# Patient Record
Sex: Female | Born: 1955 | Race: Black or African American | Hispanic: No | Smoking: Former smoker
Health system: Southern US, Community
[De-identification: ages and names within clinical notes are randomized; demographics above are authoritative.]

## PROBLEM LIST (undated history)

## (undated) DIAGNOSIS — IMO0001 Reserved for inherently not codable concepts without codable children: Secondary | ICD-10-CM

## (undated) DIAGNOSIS — D649 Anemia, unspecified: Secondary | ICD-10-CM

## (undated) DIAGNOSIS — Z789 Other specified health status: Secondary | ICD-10-CM

## (undated) DIAGNOSIS — I10 Essential (primary) hypertension: Secondary | ICD-10-CM

## (undated) DIAGNOSIS — E119 Type 2 diabetes mellitus without complications: Secondary | ICD-10-CM

## (undated) DIAGNOSIS — N184 Chronic kidney disease, stage 4 (severe): Secondary | ICD-10-CM

## (undated) DIAGNOSIS — I509 Heart failure, unspecified: Secondary | ICD-10-CM

---

## 2018-05-29 ENCOUNTER — Inpatient Hospital Stay
Admission: EM | Admit: 2018-05-29 | Discharge: 2018-05-30 | DRG: 291 | Disposition: A | Discharge status: Skilled Nursing Facility | Payer: BC Managed Care – PPO | Attending: Internal Medicine | Admitting: Internal Medicine

## 2018-05-29 ENCOUNTER — Emergency Department: Payer: BC Managed Care – PPO

## 2018-05-29 ENCOUNTER — Other Ambulatory Visit: Payer: Self-pay

## 2018-05-29 ENCOUNTER — Encounter: Payer: Self-pay | Admitting: Emergency Medicine

## 2018-05-29 DIAGNOSIS — Z515 Encounter for palliative care: Secondary | ICD-10-CM | POA: Diagnosis not present

## 2018-05-29 DIAGNOSIS — J810 Acute pulmonary edema: Secondary | ICD-10-CM

## 2018-05-29 DIAGNOSIS — E1122 Type 2 diabetes mellitus with diabetic chronic kidney disease: Secondary | ICD-10-CM | POA: Diagnosis present

## 2018-05-29 DIAGNOSIS — D631 Anemia in chronic kidney disease: Secondary | ICD-10-CM | POA: Diagnosis present

## 2018-05-29 DIAGNOSIS — N184 Chronic kidney disease, stage 4 (severe): Secondary | ICD-10-CM | POA: Diagnosis present

## 2018-05-29 DIAGNOSIS — I509 Heart failure, unspecified: Secondary | ICD-10-CM

## 2018-05-29 DIAGNOSIS — Z531 Procedure and treatment not carried out because of patient's decision for reasons of belief and group pressure: Secondary | ICD-10-CM | POA: Diagnosis present

## 2018-05-29 DIAGNOSIS — Z7189 Other specified counseling: Secondary | ICD-10-CM | POA: Diagnosis not present

## 2018-05-29 DIAGNOSIS — I5043 Acute on chronic combined systolic (congestive) and diastolic (congestive) heart failure: Secondary | ICD-10-CM | POA: Diagnosis present

## 2018-05-29 DIAGNOSIS — J9621 Acute and chronic respiratory failure with hypoxia: Secondary | ICD-10-CM | POA: Diagnosis present

## 2018-05-29 DIAGNOSIS — Z7401 Bed confinement status: Secondary | ICD-10-CM

## 2018-05-29 DIAGNOSIS — J441 Chronic obstructive pulmonary disease with (acute) exacerbation: Secondary | ICD-10-CM | POA: Diagnosis present

## 2018-05-29 DIAGNOSIS — Z880 Allergy status to penicillin: Secondary | ICD-10-CM | POA: Diagnosis not present

## 2018-05-29 DIAGNOSIS — I130 Hypertensive heart and chronic kidney disease with heart failure and stage 1 through stage 4 chronic kidney disease, or unspecified chronic kidney disease: Secondary | ICD-10-CM | POA: Diagnosis present

## 2018-05-29 DIAGNOSIS — Z87891 Personal history of nicotine dependence: Secondary | ICD-10-CM | POA: Diagnosis not present

## 2018-05-29 DIAGNOSIS — D649 Anemia, unspecified: Secondary | ICD-10-CM

## 2018-05-29 HISTORY — DX: Type 2 diabetes mellitus without complications: E11.9

## 2018-05-29 HISTORY — DX: Essential (primary) hypertension: I10

## 2018-05-29 HISTORY — DX: Anemia, unspecified: D64.9

## 2018-05-29 HISTORY — DX: Chronic kidney disease, stage 4 (severe): N18.4

## 2018-05-29 HISTORY — DX: Heart failure, unspecified: I50.9

## 2018-05-29 HISTORY — DX: Other specified health status: Z78.9

## 2018-05-29 HISTORY — DX: Reserved for inherently not codable concepts without codable children: IMO0001

## 2018-05-29 LAB — CBC WITH DIFFERENTIAL/PLATELET
Abs Immature Granulocytes: 0.02 10*3/uL (ref 0.00–0.07)
BASOS ABS: 0.1 10*3/uL (ref 0.0–0.1)
Basophils Relative: 1 %
EOS ABS: 0 10*3/uL (ref 0.0–0.5)
Eosinophils Relative: 0 %
HEMATOCRIT: 22.2 % — AB (ref 36.0–46.0)
Hemoglobin: 6.5 g/dL — ABNORMAL LOW (ref 12.0–15.0)
Immature Granulocytes: 0 %
Lymphocytes Relative: 18 %
Lymphs Abs: 1.3 10*3/uL (ref 0.7–4.0)
MCH: 30.5 pg (ref 26.0–34.0)
MCHC: 29.3 g/dL — ABNORMAL LOW (ref 30.0–36.0)
MCV: 104.2 fL — ABNORMAL HIGH (ref 80.0–100.0)
Monocytes Absolute: 0.5 10*3/uL (ref 0.1–1.0)
Monocytes Relative: 7 %
Neutro Abs: 5.3 10*3/uL (ref 1.7–7.7)
Neutrophils Relative %: 74 %
Platelets: 349 10*3/uL (ref 150–400)
RBC: 2.13 MIL/uL — ABNORMAL LOW (ref 3.87–5.11)
RDW: 17.3 % — AB (ref 11.5–15.5)
WBC: 7.2 10*3/uL (ref 4.0–10.5)
nRBC: 0.4 % — ABNORMAL HIGH (ref 0.0–0.2)

## 2018-05-29 LAB — INFLUENZA PANEL BY PCR (TYPE A & B)
Influenza A By PCR: NEGATIVE
Influenza B By PCR: NEGATIVE

## 2018-05-29 LAB — BASIC METABOLIC PANEL
Anion gap: 8 (ref 5–15)
BUN: 91 mg/dL — ABNORMAL HIGH (ref 8–23)
CO2: 23 mmol/L (ref 22–32)
Calcium: 7.8 mg/dL — ABNORMAL LOW (ref 8.9–10.3)
Chloride: 113 mmol/L — ABNORMAL HIGH (ref 98–111)
Creatinine, Ser: 3.12 mg/dL — ABNORMAL HIGH (ref 0.44–1.00)
GFR calc Af Amer: 18 mL/min — ABNORMAL LOW (ref >60)
GFR calc non Af Amer: 15 mL/min — ABNORMAL LOW (ref >60)
Glucose, Bld: 145 mg/dL — ABNORMAL HIGH (ref 70–99)
Potassium: 4.3 mmol/L (ref 3.5–5.1)
Sodium: 144 mmol/L (ref 135–145)

## 2018-05-29 LAB — GLUCOSE, CAPILLARY: Glucose-Capillary: 195 mg/dL — ABNORMAL HIGH (ref 70–99)

## 2018-05-29 LAB — HEMOGLOBIN A1C
HEMOGLOBIN A1C: 5.5 % (ref 4.8–5.6)
MEAN PLASMA GLUCOSE: 111.15 mg/dL

## 2018-05-29 LAB — TROPONIN I: Troponin I: 0.04 ng/mL (ref <0.03)

## 2018-05-29 MED ORDER — FUROSEMIDE 10 MG/ML IJ SOLN: 80.0000 mg | Freq: Two times a day (BID) | INTRAMUSCULAR | Status: DC

## 2018-05-29 MED ORDER — ALBUTEROL SULFATE (2.5 MG/3ML) 0.083% IN NEBU
5.0000 mg | INHALATION_SOLUTION | Freq: Once | RESPIRATORY_TRACT | Status: AC
  Administered 2018-05-29: 5 mg via RESPIRATORY_TRACT
  Filled 2018-05-29: qty 6

## 2018-05-29 MED ORDER — MAGNESIUM SULFATE 2 GM/50ML IV SOLN
2.0000 g | INTRAVENOUS | Status: AC
  Administered 2018-05-29: 2 g via INTRAVENOUS
  Filled 2018-05-29: qty 50

## 2018-05-29 MED ORDER — INSULIN ASPART 100 UNIT/ML ~~LOC~~ SOLN
0.0000 [IU] | Freq: Three times a day (TID) | SUBCUTANEOUS | Status: DC
  Administered 2018-05-30: 5 [IU] via SUBCUTANEOUS
  Administered 2018-05-30: 3 [IU] via SUBCUTANEOUS
  Filled 2018-05-29 (×2): qty 1

## 2018-05-29 MED ORDER — SODIUM CHLORIDE 0.9 % IV SOLN
2.0000 g | Freq: Once | INTRAVENOUS | Status: AC
  Administered 2018-05-29: 2 g via INTRAVENOUS
  Filled 2018-05-29: qty 20

## 2018-05-29 MED ORDER — SODIUM CHLORIDE 0.9 % IV SOLN
500.0000 mg | Freq: Once | INTRAVENOUS | Status: AC
  Administered 2018-05-29: 500 mg via INTRAVENOUS
  Filled 2018-05-29: qty 500

## 2018-05-29 MED ORDER — ACETAMINOPHEN 650 MG RE SUPP: 650.0000 mg | Freq: Four times a day (QID) | RECTAL | Status: DC | PRN

## 2018-05-29 MED ORDER — HYDRALAZINE HCL 20 MG/ML IJ SOLN: 10.0000 mg | Freq: Four times a day (QID) | INTRAMUSCULAR | Status: DC | PRN

## 2018-05-29 MED ORDER — ACETAMINOPHEN 325 MG PO TABS: 650.0000 mg | ORAL_TABLET | Freq: Four times a day (QID) | ORAL | Status: DC | PRN

## 2018-05-29 MED ORDER — METHYLPREDNISOLONE SODIUM SUCC 125 MG IJ SOLR
125.0000 mg | Freq: Once | INTRAMUSCULAR | Status: AC
  Administered 2018-05-29: 125 mg via INTRAVENOUS
  Filled 2018-05-29: qty 2

## 2018-05-29 MED ORDER — POLYETHYLENE GLYCOL 3350 17 G PO PACK: 17.0000 g | PACK | Freq: Every day | ORAL | Status: DC | PRN

## 2018-05-29 MED ORDER — HEPARIN SODIUM (PORCINE) 5000 UNIT/ML IJ SOLN: 5000.0000 [IU] | Freq: Three times a day (TID) | INTRAMUSCULAR | Status: DC

## 2018-05-29 MED ORDER — FUROSEMIDE 10 MG/ML IJ SOLN
40.0000 mg | Freq: Once | INTRAMUSCULAR | Status: AC
  Administered 2018-05-29: 40 mg via INTRAVENOUS
  Filled 2018-05-29: qty 4

## 2018-05-29 MED ORDER — ALBUTEROL SULFATE (2.5 MG/3ML) 0.083% IN NEBU: 2.5000 mg | INHALATION_SOLUTION | RESPIRATORY_TRACT | Status: DC | PRN

## 2018-05-29 MED ORDER — SODIUM CHLORIDE 0.9% FLUSH
3.0000 mL | Freq: Two times a day (BID) | INTRAVENOUS | Status: DC
  Administered 2018-05-29 – 2018-05-30 (×2): 3 mL via INTRAVENOUS

## 2018-05-29 MED ORDER — ONDANSETRON HCL 4 MG PO TABS: 4.0000 mg | ORAL_TABLET | Freq: Four times a day (QID) | ORAL | Status: DC | PRN

## 2018-05-29 MED ORDER — ONDANSETRON HCL 4 MG/2ML IJ SOLN: 4.0000 mg | Freq: Four times a day (QID) | INTRAMUSCULAR | Status: DC | PRN

## 2018-05-29 NOTE — ED Notes (Signed)
Indwelling catheter not inserted, entry made in error

## 2018-05-29 NOTE — ED Provider Notes (Signed)
[facility] Emergency Department Provider Note  ____________________________________________  Time seen: Approximately 6:57 PM  I have reviewed the triage vital signs and the nursing notes.   HISTORY  Chief Complaint Shortness of Breath    HPI Jenny [name] is a 62 y.o. female with a history of diabetes hypertension and CHF who comes to the ED due to shortness of breath that started today.  Gradual onset, worsening.  No fever or chills.  Denies chest pain.  Shortness of breath is severe without aggravating or alleviating factors.      Past Medical History:  Diagnosis Date  . Anemia   . CHF (congestive heart failure) (Jenny [name])   . Diabetes mellitus without complication (Jenny [name])   . Hypertension      There are no active problems to display for this patient.    History reviewed. No pertinent surgical history.   Prior to Admission medications   Not on File     Allergies Penicillins   No family history on file.  Social History Social History   Tobacco Use  . Smoking status: Former Research scientist (life sciences)  . Smokeless tobacco: Never Used  Substance Use Topics  . Alcohol use: Not Currently  . Drug use: Never    Review of Systems  Constitutional:   No fever or chills.  ENT:   No sore throat. No rhinorrhea. Cardiovascular:   No chest pain or syncope. Respiratory:   Positive shortness of breath and cough. Gastrointestinal:   Negative for abdominal pain, vomiting and diarrhea.  Musculoskeletal:   Negative for focal pain or swelling All other systems reviewed and are negative except as documented above in ROS and HPI.  ____________________________________________   PHYSICAL EXAM:  VITAL SIGNS: ED Triage Vitals  Enc Vitals Group     BP 05/29/18 1645 (!) 160/76     Pulse Rate 05/29/18 1645 60     Resp 05/29/18 1652 (!) 22     Temp 05/29/18 1652 97.9 F (36.6 C)     Temp Source 05/29/18 1652 Oral     SpO2 05/29/18 1645 97 %     Weight --      Height  --      Head Circumference --      [place] --      Pain Score 05/29/18 1652 0     Pain Loc --      Pain Edu? --      Excl. in [place]? --     Vital signs reviewed, nursing assessments reviewed.   Constitutional:   Alert and oriented.  Ill-appearing. Eyes:   Conjunctivae are normal. EOMI. PERRL. ENT      Head:   Normocephalic and atraumatic.      Nose:   No congestion/rhinnorhea.       Mouth/Throat:   MMM, no pharyngeal erythema. No peritonsillar mass.       Neck:   No meningismus. Full ROM. Hematological/Lymphatic/Immunilogical:   No cervical lymphadenopathy. Cardiovascular:   RRR. Symmetric bilateral radial and DP pulses.  No murmurs. Cap refill less than 2 seconds. Respiratory: Tachypnea, increased work of breathing.  Prolonged expiratory phase and diffuse expiratory wheezing.  Crackles at left base.  Only able to speak 1 word at a time.. Gastrointestinal:   Soft and nontender. Non distended. There is no CVA tenderness.  No rebound, rigidity, or guarding.  Musculoskeletal:   Normal range of motion in all extremities. No joint effusions.  No lower extremity tenderness.  No edema. Neurologic:   Normal  speech and language.  Motor grossly intact. No acute focal neurologic deficits are appreciated.  Skin:    Skin is warm, dry and intact. No rash noted.  No petechiae, purpura, or bullae.  ____________________________________________    LABS (pertinent positives/negatives) (all labs ordered are listed, but only abnormal results are displayed) Labs Reviewed  CBC WITH DIFFERENTIAL/PLATELET - Abnormal; Notable for the following components:      Result Value   RBC 2.13 (*)    Hemoglobin 6.5 (*)    HCT 22.2 (*)    MCV 104.2 (*)    MCHC 29.3 (*)    RDW 17.3 (*)    nRBC 0.4 (*)    All other components within normal limits  BASIC METABOLIC PANEL - Abnormal; Notable for the following components:   Chloride 113 (*)    Glucose, Bld 145 (*)    BUN 91 (*)    Creatinine, Ser 3.12 (*)     Calcium 7.8 (*)    GFR calc non Af Amer 15 (*)    GFR calc Af Amer 18 (*)    All other components within normal limits  TROPONIN I - Abnormal; Notable for the following components:   Troponin I 0.04 (*)    All other components within normal limits  INFLUENZA PANEL BY PCR (TYPE A & B)   ____________________________________________   EKG  Interpreted by me Sinus rhythm rate of 59, normal axis and intervals.  Poor R wave progression.  Normal ST segments and T waves.  No ischemic changes.  ____________________________________________    [id number]  Dg Chest 2 View  Result Date: 05/29/2018 CLINICAL DATA:  Shortness of breath, wheezing, hypoxia EXAM: CHEST - 2 VIEW COMPARISON:  04/05/2018 FINDINGS: Cardiomegaly with vascular congestion. Interstitial prominence likely reflects interstitial edema. Moderate left pleural effusion and small right effusion with bibasilar atelectasis. No acute bony abnormality. IMPRESSION: Cardiomegaly with vascular congestion and probable interstitial edema. Bilateral effusions, left greater than right with bibasilar atelectasis. Electronically Signed   By: Rolm Baptise M.D.   On: 05/29/2018 18:44    ____________________________________________   PROCEDURES Procedures  ____________________________________________  DIFFERENTIAL DIAGNOSIS   COPD exacerbation, pneumonia, pulmonary edema, pleural effusion.  Doubt ACS PE dissection AAA or pneumothorax.  CLINICAL IMPRESSION / ASSESSMENT AND PLAN / ED COURSE  Pertinent labs & imaging results that were available during my care of the patient were reviewed by me and considered in my medical decision making (see chart for details).      Clinical Course as of May 29 1856  Thu May 29, 2018  1654 Patient presents with acute respiratory illness with evidence of bronchospasm and wheezing.  I doubt sepsis on initial assessment.  Will check labs and a chest x-ray, give bronchodilators and steroids, plan for  hospitalization.   [PS]  0300 Baseline CKD.  Chronic anemia.  Jehovah's Witness.  Creatinine(!): 3.12 [PS]    Clinical Course User Index [PS] Jenny Mew, MD    ----------------------------------------- 7:13 PM on 05/29/2018 -----------------------------------------  Labs essentially at baseline, chest x-ray shows pulmonary edema.  Patient ordered IV Lasix.  Plan to admit for further diuresis and respiratory support.   ____________________________________________   FINAL CLINICAL IMPRESSION(S) / ED DIAGNOSES    Final diagnoses:  COPD exacerbation ([place])  Acute pulmonary edema ([place])  Acute respiratory failure with hypoxia   ED Discharge Orders    None      Portions of this note were generated with dragon dictation software. Dictation errors may occur despite best attempts  at proofreading.   Jenny Mew, MD 05/29/18 [phone]

## 2018-05-29 NOTE — ED Triage Notes (Signed)
Pt to ED via EMS from [facility] with c/o SOB, hx of CHF, anemia. 1 albuterol and 1 duoneb given PTA. PT on 8L Micro from EMS. PT RA saturation at this time is 88%. MD at bedside

## 2018-05-29 NOTE — ED Notes (Signed)
Pt external catheter repositioned due to pt feeling "wet", pt brief clean and try, pt provided a sandwich tray and cup of water with MD approval. Pt expresses no further complaints at this time

## 2018-05-29 NOTE — H&P (Addendum)
[place] [facility] [place] NAME: Jenny [name]    MR#:  [id number]  DATE OF BIRTH:  [birth year]  DATE OF ADMISSION:  05/29/2018  PRIMARY CARE PHYSICIAN: Juluis Pitch, MD   REQUESTING/REFERRING PHYSICIAN: Dr. Joni Fears  CHIEF COMPLAINT:   Chief Complaint  Patient presents with  . Shortness of Breath    HISTORY OF PRESENT ILLNESS:  Jenny [name]  is a 62 y.o. female with a known history of chronic systolic CHF, CKD stage IV, chronic anemia, Jehovah's Witness,, diabetes mellitus, bedbound status who was recently admitted at [facility] in the early part of December for CHF and sent to [facility] for rehab presents due to worsening shortness of breath and weakness.  Patient's chest x-ray shows bilateral pleural effusions with pulmonary edema.  Is alert and awake and able to provide history.  History also obtained from son at bedside and reviewed records from [facility].  PAST MEDICAL HISTORY:   Past Medical History:  Diagnosis Date  . Anemia   . CHF (congestive heart failure) ([place])   . CKD (chronic kidney disease) stage 4, GFR 15-29 ml/min (HCC)   . Diabetes mellitus without complication ([place])   . Hypertension     PAST SURGICAL HISTORY:  History reviewed. No pertinent surgical history.  SOCIAL HISTORY:   Social History   Tobacco Use  . Smoking status: Former Research scientist (life sciences)  . Smokeless tobacco: Never Used  Substance Use Topics  . Alcohol use: Not Currently    FAMILY HISTORY:  No family history on file.  DRUG ALLERGIES:   Allergies  Allergen Reactions  . Penicillins Rash    REVIEW OF SYSTEMS:   Review of Systems  Constitutional: Positive for malaise/fatigue. Negative for chills and fever.  HENT: Negative for sore throat.   Eyes: Negative for blurred vision, double vision and pain.  Respiratory: Positive for cough and shortness of breath. Negative for hemoptysis and wheezing.   Cardiovascular: Positive for orthopnea and leg swelling. Negative  for chest pain and palpitations.  Gastrointestinal: Negative for abdominal pain, constipation, diarrhea, heartburn, nausea and vomiting.  Genitourinary: Negative for dysuria and hematuria.  Musculoskeletal: Negative for back pain and joint pain.  Skin: Negative for rash.  Neurological: Negative for sensory change, speech change, focal weakness and headaches.  Endo/Heme/Allergies: Does not bruise/bleed easily.  Psychiatric/Behavioral: Positive for memory loss. Negative for depression. The patient is not nervous/anxious.     MEDICATIONS AT HOME:   Prior to Admission medications   Not on File     VITAL SIGNS:  Blood pressure (!) 174/71, pulse 64, temperature 97.9 F (36.6 C), temperature source Oral, resp. rate 15, SpO2 95 %.  PHYSICAL EXAMINATION:  Physical Exam  GENERAL:  62 y.o.-year-old patient lying in the bed. EYES: Pupils equal, round, reactive to light and accommodation. No scleral icterus. Extraocular muscles intact.  HEENT: Head atraumatic, normocephalic. Oropharynx and nasopharynx clear. No oropharyngeal erythema, moist oral mucosa  NECK:  Supple, no jugular venous distention. No thyroid enlargement, no tenderness.  LUNGS: Bilateral wheezing CARDIOVASCULAR: S1, S2 normal. No murmurs, rubs, or gallops.  ABDOMEN: Soft, nontender, nondistended. Bowel sounds present. No organomegaly or mass.  EXTREMITIES: No cyanosis, or clubbing. + 2 pedal & radial pulses b/l.  Bilateral lower extremity edema NEUROLOGIC: Cranial nerves II through XII are intact. No focal Motor or sensory deficits appreciated b/l PSYCHIATRIC: The patient is alert and oriented x 3. Anxious SKIN: No obvious rash, lesion, or ulcer.   LABORATORY PANEL:   CBC Recent  Labs  Lab 05/29/18 1701  WBC 7.2  HGB 6.5*  HCT 22.2*  PLT 349   ------------------------------------------------------------------------------------------------------------------  Chemistries  Recent Labs  Lab 05/29/18 1701  NA 144  K  4.3  CL 113*  CO2 23  GLUCOSE 145*  BUN 91*  CREATININE 3.12*  CALCIUM 7.8*   ------------------------------------------------------------------------------------------------------------------  Cardiac Enzymes Recent Labs  Lab 05/29/18 1701  TROPONINI 0.04*   ------------------------------------------------------------------------------------------------------------------  RADIOLOGY:  Dg Chest 2 View  Result Date: 05/29/2018 CLINICAL DATA:  Shortness of breath, wheezing, hypoxia EXAM: CHEST - 2 VIEW COMPARISON:  04/05/2018 FINDINGS: Cardiomegaly with vascular congestion. Interstitial prominence likely reflects interstitial edema. Moderate left pleural effusion and small right effusion with bibasilar atelectasis. No acute bony abnormality. IMPRESSION: Cardiomegaly with vascular congestion and probable interstitial edema. Bilateral effusions, left greater than right with bibasilar atelectasis. Electronically Signed   By: Rolm Baptise M.D.   On: 05/29/2018 18:44     IMPRESSION AND PLAN:   *Acute on chronic systolic congestive heart failure with acute hypoxic respiratory failure - IV Lasix 80 mg BID - Input and Output - Counseled to limit fluids and Salt - Monitor Bun/Cr and Potassium - Echo-reviewed from [facility] records.  Ejection fraction 45 to 50%.  *CKD stage IV.  Monitor while being diuresed.  Nephrology consult if worsening.  Would benefit from Lasix drip if an adequate diuresis. Discussed with patient and she does not want hemodialysis.  *Diabetes mellitus.  Sliding scale insulin.  *Anemia of chronic disease.  No bleeding.  Jehovah's Witness.  No transfusion.  *Hypertension.  Home medications not available.  Would hold medications at this time while being diuresed.  Patient had significant issues with hypotension in [facility] with diuresis.  DVT prophylaxis with subcutaneous heparin   All the records are reviewed and case discussed with ED provider. Management plans discussed  with the patient, family and they are in agreement.  CODE STATUS: FULL CODE  TOTAL TIME TAKING CARE OF THIS PATIENT: 40 minutes.   Leia Alf Buddy Loeffelholz M.D on 05/29/2018 at 8:41 PM  Between 7am to 6pm - Pager - [phone]  After 6pm go to [email or web address] - password [email or web address]  Office  [phone]  CC: Primary care physician; Juluis Pitch, MD  Note: This dictation was prepared with Dragon dictation along with smaller phrase technology. Any transcriptional errors that result from this process are unintentional.

## 2018-05-29 NOTE — ED Notes (Signed)
Pt provided warm blanket by this tech, and pt belongings packed up in preparation to be transported upstairs, pt expresses no further complaints at this time

## 2018-05-29 NOTE — Progress Notes (Signed)
Advance care planning  Purpose of Encounter Ingestive heart failure, CKD and anemia  Parties in Attendance Patient and her 2 sons at bedside  Patients Decisional capacity Patient is alert and oriented.  Able to make medical decisions. Healthcare power of attorney is her son Tonna Boehringer  Discussed with patient and son at bedside regarding her congestive heart failure and need for admission.  All questions answered  Discussed extensively regarding her CODE STATUS.  Patient continues to be wanting to be a full code with aggressive treatment.  But she does not want hemodialysis even if her kidneys worsen.  We discussed regarding having palliative care discuss goals of care further in the morning.  Patient and son agree.  Orders entered for full code  Time spent - 18 minutes

## 2018-05-30 ENCOUNTER — Encounter: Payer: Self-pay | Admitting: Primary Care

## 2018-05-30 DIAGNOSIS — Z7189 Other specified counseling: Secondary | ICD-10-CM

## 2018-05-30 DIAGNOSIS — I5043 Acute on chronic combined systolic (congestive) and diastolic (congestive) heart failure: Secondary | ICD-10-CM

## 2018-05-30 DIAGNOSIS — Z515 Encounter for palliative care: Secondary | ICD-10-CM

## 2018-05-30 LAB — IRON AND TIBC
IRON: 23 ug/dL — AB (ref 28–170)
Saturation Ratios: 11 % (ref 10.4–31.8)
TIBC: 220 ug/dL — AB (ref 250–450)
UIBC: 197 ug/dL

## 2018-05-30 LAB — BASIC METABOLIC PANEL
Anion gap: 11 (ref 5–15)
BUN: 103 mg/dL — ABNORMAL HIGH (ref 8–23)
CHLORIDE: 112 mmol/L — AB (ref 98–111)
CO2: 23 mmol/L (ref 22–32)
Calcium: 8.1 mg/dL — ABNORMAL LOW (ref 8.9–10.3)
Creatinine, Ser: 3.1 mg/dL — ABNORMAL HIGH (ref 0.44–1.00)
GFR calc Af Amer: 18 mL/min — ABNORMAL LOW (ref >60)
GFR calc non Af Amer: 15 mL/min — ABNORMAL LOW (ref >60)
Glucose, Bld: 269 mg/dL — ABNORMAL HIGH (ref 70–99)
Potassium: 4.5 mmol/L (ref 3.5–5.1)
SODIUM: 146 mmol/L — AB (ref 135–145)

## 2018-05-30 LAB — RETICULOCYTES
Immature Retic Fract: 30.2 % — ABNORMAL HIGH (ref 2.3–15.9)
RBC.: 2.18 MIL/uL — ABNORMAL LOW (ref 3.87–5.11)
Retic Count, Absolute: 107.9 10*3/uL (ref 19.0–186.0)
Retic Ct Pct: 5 % — ABNORMAL HIGH (ref 0.4–3.1)

## 2018-05-30 LAB — CBC
HCT: 22 % — ABNORMAL LOW (ref 36.0–46.0)
Hemoglobin: 6.5 g/dL — ABNORMAL LOW (ref 12.0–15.0)
MCH: 30.1 pg (ref 26.0–34.0)
MCHC: 29.5 g/dL — ABNORMAL LOW (ref 30.0–36.0)
MCV: 101.9 fL — ABNORMAL HIGH (ref 80.0–100.0)
Platelets: 368 10*3/uL (ref 150–400)
RBC: 2.16 MIL/uL — ABNORMAL LOW (ref 3.87–5.11)
RDW: 17.1 % — ABNORMAL HIGH (ref 11.5–15.5)
WBC: 6.9 10*3/uL (ref 4.0–10.5)
nRBC: 0.3 % — ABNORMAL HIGH (ref 0.0–0.2)

## 2018-05-30 LAB — MRSA PCR SCREENING: MRSA by PCR: NEGATIVE

## 2018-05-30 LAB — FERRITIN: Ferritin: 530 ng/mL — ABNORMAL HIGH (ref 11–307)

## 2018-05-30 LAB — VITAMIN B12: Vitamin B-12: 864 pg/mL (ref 180–914)

## 2018-05-30 LAB — FOLATE: Folate: 11.9 ng/mL (ref >5.9)

## 2018-05-30 LAB — GLUCOSE, CAPILLARY
GLUCOSE-CAPILLARY: 241 mg/dL — AB (ref 70–99)
Glucose-Capillary: 282 mg/dL — ABNORMAL HIGH (ref 70–99)

## 2018-05-30 MED ORDER — ATORVASTATIN CALCIUM 80 MG PO TABS: 80.0000 mg | ORAL_TABLET | Freq: Every day | ORAL | 11 refills | Status: DC

## 2018-05-30 MED ORDER — ISOSORBIDE MONONITRATE ER 60 MG PO TB24: 60.0000 mg | ORAL_TABLET | Freq: Every day | ORAL | Status: AC

## 2018-05-30 MED ORDER — HYDRALAZINE HCL 25 MG PO TABS: 25.0000 mg | ORAL_TABLET | Freq: Two times a day (BID) | ORAL | 0 refills | Status: DC

## 2018-05-30 MED ORDER — HYDRALAZINE HCL 25 MG PO TABS
25.0000 mg | ORAL_TABLET | Freq: Two times a day (BID) | ORAL | Status: DC
  Administered 2018-05-30: 25 mg via ORAL
  Filled 2018-05-30: qty 1

## 2018-05-30 MED ORDER — BUMETANIDE 1 MG PO TABS: 1.0000 mg | ORAL_TABLET | Freq: Every day | ORAL | 11 refills | Status: DC

## 2018-05-30 MED ORDER — FUROSEMIDE 10 MG/ML IJ SOLN
80.0000 mg | Freq: Two times a day (BID) | INTRAMUSCULAR | Status: DC
  Administered 2018-05-30: 80 mg via INTRAVENOUS
  Filled 2018-05-30: qty 8

## 2018-05-30 MED ORDER — SODIUM CHLORIDE 0.9 % IV SOLN
510.0000 mg | Freq: Once | INTRAVENOUS | Status: AC
  Administered 2018-05-30: 510 mg via INTRAVENOUS
  Filled 2018-05-30: qty 17

## 2018-05-30 NOTE — Consult Note (Signed)
MEDICATION RELATED CONSULT NOTE - INITIAL   Pharmacy Consult for feraheme dosing Indication: IDA and hgb 6.5 with CKD not on dialysis  Allergies  Allergen Reactions  . Penicillins Rash    Patient Measurements: Height: 5\' 5"  (165.1 cm) Weight: 142 lb 12.8 oz (64.8 kg) IBW/kg (Calculated) : 57 Adjusted Body Weight:   Vital Signs: Temp: 97.8 F (36.6 C) (12/27 0356) Temp Source: Oral (12/27 0356) BP: 173/69 (12/27 0356) Pulse Rate: 70 (12/27 0356) Intake/Output from previous day: 12/26 0701 - 12/27 0700 In: 402.1 [IV Piggyback:402.1] Out: 100 [Urine:100] Intake/Output from this shift: Total I/O In: -  Out: 150 [Urine:150]  Labs: Recent Labs    05/29/18 1701 05/30/18 0511  WBC 7.2 6.9  HGB 6.5* 6.5*  HCT 22.2* 22.0*  PLT 349 368  CREATININE 3.12* 3.10*   Estimated Creatinine Clearance: 16.7 mL/min (A) (by C-G formula based on SCr of 3.1 mg/dL (H)).   Microbiology: Recent Results (from the past 720 hour(s))  MRSA PCR Screening     Status: None   Collection Time: 05/30/18  4:39 AM  Result Value Ref Range Status   MRSA by PCR NEGATIVE NEGATIVE Final    Comment:        The GeneXpert MRSA Assay (FDA approved for NASAL specimens only), is one component of a comprehensive MRSA colonization surveillance program. It is not intended to diagnose MRSA infection nor to guide or monitor treatment for MRSA infections. Performed at [facility], [address] [place], [place] [address]     Medical History: Past Medical History:  Diagnosis Date  . Anemia   . CHF (congestive heart failure) ([place])   . CKD (chronic kidney disease) stage 4, GFR 15-29 ml/min (HCC)   . Diabetes mellitus without complication ([place])   . Hypertension   . Patient is Jehovah's Witness     Assessment: 62 y.o. female with a known history of chronic systolic CHF, CKD stage IV, chronic anemia (Jehovah's Witness), diabetes mellitus, bedbound status.  Hgb 6.5.  Iron studies mildly  below lower limit of reference range.  Calculated hgb iron deficit is approximately 1000 mg.  Originally consulted for venofer but due to MD discharging the patient discussed one time dose of feraheme 510 mg then for patient to follow up with nephrologist outpatient for second dose approximate 7 days after initial dose (3-8 days later).  Plan:  Feraheme 510 mg IV once.  Forrest Moron, PharmD Clinical Pharmacist 05/30/2018,11:14 AM

## 2018-05-30 NOTE — Progress Notes (Signed)
   05/30/18 1050  Clinical Encounter Type  Visited With Patient not available  Visit Type Initial (OR for AD)  Referral From Nurse  Recommendations follow up again later today   Chaplain attempted to respond to OR for AD.  Patient did not respond to chaplain's knock or voice.  Attempt follow up later today.

## 2018-05-30 NOTE — Discharge Summary (Signed)
[facility] NAME: Jenny [name]    MR#:  [id number]  DATE OF BIRTH:  [birth year]  DATE OF ADMISSION:  05/29/2018 ADMITTING PHYSICIAN: Hillary Bow, MD  DATE OF DISCHARGE: 05/30/2018  PRIMARY CARE PHYSICIAN: Juluis Pitch, MD    ADMISSION DIAGNOSIS:  Acute pulmonary edema ([place]) [J81.0] COPD exacerbation ([place]) [J44.1] Chronic anemia [D64.9]  DISCHARGE DIAGNOSIS:  Active Problems:   CHF (congestive heart failure) ([place])  Anemia Jehovah's Witness SECONDARY DIAGNOSIS:   Past Medical History:  Diagnosis Date  . Anemia   . CHF (congestive heart failure) ([place])   . CKD (chronic kidney disease) stage 4, GFR 15-29 ml/min (HCC)   . Diabetes mellitus without complication ([place])   . Hypertension   . 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  1.  Acute on chronic hypoxic respiratory failure due to acute on chronic combined systolic and diastolic heart failure: Patient has been weaned to her baseline oxygen of 2 L. She was diuresed with IV Lasix Due to her anemia patient will have ongoing episodes of congestive heart failure flare.  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  I believe that the anemia is playing a large role in her congestive heart failure.  This was discussed with the patient.  She also has renal failure stage IV approaching stage V followed by nephrology which is also affecting her pulmonary status. During her last admission in December she was discharged with Bumex which she will continue.    If heart rate allows as an outpatient she may have Coreg restarted. CHF clinic upon discharge   2.  Chronic kidney disease stage IV, baseline  creatinine around 3.0: Creatinine has remained stable.  She will follow-up with her nephrologist.  3.  Anemia of chronic kidney disease: Patient is Jehovah's Witness.  Her baseline hemoglobin is 6.5 and it remained stable. She did receive 1 dose of Feraheme and will need another dose 7 days from now.  This can be given by her nephrologist.  4.  Essential hypertension: Patient will be discharged on Isosorbide.  Her heart rate is low and therefore I am uncomfortable to restart the Coreg.  She will need outpatient palliative care services.    DISCHARGE CONDITIONS AND DIET:   Guarded condition on heart healthy diet  CONSULTS OBTAINED:    DRUG ALLERGIES:   Allergies  Allergen Reactions  . Penicillins Rash    DISCHARGE MEDICATIONS:   Allergies as of 05/30/2018      Reactions   Penicillins Rash      Medication List    TAKE these medications   atorvastatin 80 MG tablet Commonly known as:  LIPITOR Take 1 tablet (80 mg total) by mouth daily.   bumetanide 1 MG tablet Commonly known as:  BUMEX Take 1 tablet (1 mg total) by mouth daily.   isosorbide mononitrate 60 MG 24 hr tablet Commonly known as:  IMDUR Take 1 tablet (60 mg total) by mouth daily.         Today   CHIEF COMPLAINT:   Shortness of breath with improvement.  Denies lower extremity edema, chest pain   VITAL SIGNS:  Blood pressure (!) 173/69, pulse 70, temperature 97.8 F (36.6  C), temperature source Oral, resp. rate 16, height 5\' 5"  (1.651 m), weight 64.8 kg, SpO2 93 %.   REVIEW OF SYSTEMS:  Review of Systems  Constitutional: Negative.  Negative for chills, fever and malaise/fatigue.  HENT: Negative.  Negative for ear discharge, ear pain, hearing loss, nosebleeds and sore throat.   Eyes: Negative.  Negative for blurred vision and pain.  Respiratory: Positive for shortness of breath (better). Negative for cough, hemoptysis and wheezing.   Cardiovascular: Negative.  Negative for chest pain,  palpitations and leg swelling.  Gastrointestinal: Negative.  Negative for abdominal pain, blood in stool, diarrhea, nausea and vomiting.  Genitourinary: Negative.  Negative for dysuria.  Musculoskeletal: Negative.  Negative for back pain.  Skin: Negative.   Neurological: Negative for dizziness, tremors, speech change, focal weakness, seizures and headaches.  Endo/Heme/Allergies: Negative.  Does not bruise/bleed easily.  Psychiatric/Behavioral: Negative.  Negative for depression, hallucinations and suicidal ideas.     PHYSICAL EXAMINATION:  GENERAL:  62 y.o.-year-old patient lying in the bed with no acute distress.  NECK:  Supple, no jugular venous distention. No thyroid enlargement, no tenderness.  LUNGS: Normal breath sounds bilaterally, no wheezing, rales,rhonchi  No use of accessory muscles of respiration.  CARDIOVASCULAR: S1, S2 normal. No murmurs, rubs, or gallops.  ABDOMEN: Soft, non-tender, non-distended. Bowel sounds present. No organomegaly or mass.  EXTREMITIES: No pedal edema, cyanosis, or clubbing.  PSYCHIATRIC: The patient is alert and oriented x 3.  SKIN: No obvious rash, lesion, or ulcer.   DATA REVIEW:   CBC Recent Labs  Lab 05/30/18 0511  WBC 6.9  HGB 6.5*  HCT 22.0*  PLT 368    Chemistries  Recent Labs  Lab 05/30/18 0511  NA 146*  K 4.5  CL 112*  CO2 23  GLUCOSE 269*  BUN 103*  CREATININE 3.10*  CALCIUM 8.1*    Cardiac Enzymes Recent Labs  Lab 05/29/18 1701  TROPONINI 0.04*    Microbiology Results  @MICRORSLT48 @  RADIOLOGY:  Dg Chest 2 View  Result Date: 05/29/2018 CLINICAL DATA:  Shortness of breath, wheezing, hypoxia EXAM: CHEST - 2 VIEW COMPARISON:  04/05/2018 FINDINGS: Cardiomegaly with vascular congestion. Interstitial prominence likely reflects interstitial edema. Moderate left pleural effusion and small right effusion with bibasilar atelectasis. No acute bony abnormality. IMPRESSION: Cardiomegaly with vascular congestion and  probable interstitial edema. Bilateral effusions, left greater than right with bibasilar atelectasis. Electronically Signed   By: Rolm Baptise M.D.   On: 05/29/2018 18:44      Allergies as of 05/30/2018      Reactions   Penicillins Rash      Medication List    TAKE these medications   atorvastatin 80 MG tablet Commonly known as:  LIPITOR Take 1 tablet (80 mg total) by mouth daily.   bumetanide 1 MG tablet Commonly known as:  BUMEX Take 1 tablet (1 mg total) by mouth daily.   isosorbide mononitrate 60 MG 24 hr tablet Commonly known as:  IMDUR Take 1 tablet (60 mg total) by mouth daily.          Management plans discussed with the patient and she is in agreement. Stable for discharge   Patient should follow up with nephrology   CODE STATUS:     Code Status Orders  (From admission, onward)         Start     Ordered   05/29/18 2020  Full code  Continuous     05/29/18 2023  Code Status History    This patient has a current code status but no historical code status.      TOTAL TIME TAKING CARE OF THIS PATIENT: 38 minutes.    Note: This dictation was prepared with Dragon dictation along with smaller phrase technology. Any transcriptional errors that result from this process are unintentional.  Traivon Morrical M.D on 05/30/2018 at 11:17 AM  Between 7am to 6pm - Pager - [phone] After 6pm go to [email or web address] - password [email or web address]  Office  [phone]  CC: Primary care physician; Juluis Pitch, MD

## 2018-05-30 NOTE — Consult Note (Addendum)
Consultation Note Date: 05/30/2018   Patient Name: Jenny [name]  DOB: [birth year]  MRN: [id number]  Age / Sex: 62 y.o., female  PCP: Jenny Pitch, MD Referring Physician: Bettey Costa, MD  Reason for Consultation: Establishing goals of care and Psychosocial/spiritual support  HPI/Patient Profile: 62 y.o. female  with past medical history of heart failure, stage IV kidney disease, diabetes, hypertension, anemia complicated by [facility] Witness, will not accept blood products admitted on 05/29/2018 with CHF.  Palliative medicine consulted for goals of care discussion.   Clinical Assessment and Goals of Care: Jenny [name] is resting quietly in bed.  She will make but only briefly keep eye contact.  She has a flat affect but is calm and cooperative.  There is no family at bedside at this time.  She tells me that she is originally from [place].  We talked about her faith, Jehovah's Witness.  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  We talked about her heart failure and management of heart failure.  We talked about returning to the rehab.  I ask if it has helped so far.  She states that rehab has helped her, but slowly.  Talked about healthcare power of attorney, see below. We talked about CODE STATUS, see below.  I asked what she would think about palliative services as outpatient.  She tells me that it would be good, "from a sons 10:01 AM".   Please consult PMT again upon next admission.  HCPOA  NEXT OF KIN   Jenny [name] names Jenny [name] first, Jenny [name] second her healthcare surrogates.  I share that it is important that her sons know what she does want, and what she does not want.  She agrees.  No HCPOA paperwork noted in Epic chart.    SUMMARY OF RECOMMENDATIONS   Continue to treat the treatable. Declines blood products due to faith, Jehovah's Witness. Also  declines hemodialysis due to faith, Jehovah's Witness. Continue CODE STATUS discussions.  Code Status/Advance Care Planning:  Full code -we talked about, "treat the treatable, but allow a natural death".  I share that if/when her heart naturally stops, God is calling, I consider that CPR and intubation would not change outcomes and she may suffer at the end.  I encouraged her to consider treating what is treatable, but allowing natural death.  Symptom Management:   Per hospitalist, no additional needs at this time.  Palliative Prophylaxis:   No specific needs at this time.  Additional Recommendations (Limitations, Scope, Preferences):  Full Scope Treatment, No Blood Transfusions and No Hemodialysis  Psycho-social/Spiritual:   Desire for further Chaplaincy support:no  Additional Recommendations: Caregiving  Support/Resources and Education on Hospice  Prognosis:   < 6 months, 6 or less would not be surprising based on frailty, poor functional status, chronic disease burden, patient's faith which does not allow her to take blood or have dialysis.  Complicated by relatively young age of 69.  Discharge Planning: Return to rehab      Primary  Diagnoses: Present on Admission: **None**   I have reviewed the medical record, interviewed the patient and family, and examined the patient. The following aspects are pertinent.  Past Medical History:  Diagnosis Date  . Anemia   . CHF (congestive heart failure) ([place])   . CKD (chronic kidney disease) stage 4, GFR 15-29 ml/min (HCC)   . Diabetes mellitus without complication ([place])   . Hypertension   . Patient is Sales promotion account executive Witness    Social History   Socioeconomic History  . Marital status: Divorced    Spouse name: Not on file  . Number of children: Not on file  . Years of education: Not on file  . Highest education level: Not on file  Occupational History  . Not on file  Social Needs  . Financial resource strain: Not on file    . Food insecurity:    Worry: Not on file    Inability: Not on file  . Transportation needs:    Medical: Not on file    Non-medical: Not on file  Tobacco Use  . Smoking status: Former Research scientist (life sciences)  . Smokeless tobacco: Never Used  Substance and Sexual Activity  . Alcohol use: Not Currently  . Drug use: Never  . Sexual activity: Not on file  Lifestyle  . Physical activity:    Days per week: Not on file    Minutes per session: Not on file  . Stress: Not on file  Relationships  . Social connections:    Talks on phone: Not on file    Gets together: Not on file    Attends religious service: Not on file    Active member of club or organization: Not on file    Attends meetings of clubs or organizations: Not on file    Relationship status: Not on file  Other Topics Concern  . Not on file  Social History Narrative  . Not on file   History reviewed. No pertinent family history. Scheduled Meds: . furosemide  80 mg Intravenous BID  . hydrALAZINE  25 mg Oral BID  . insulin aspart  0-9 Units Subcutaneous TID WC  . sodium chloride flush  3 mL Intravenous Q12H   Continuous Infusions: . ferumoxytol     PRN Meds:.acetaminophen **OR** acetaminophen, albuterol, hydrALAZINE, ondansetron **OR** ondansetron (ZOFRAN) IV, polyethylene glycol Medications Prior to Admission:  Prior to Admission medications   Medication Sig Start Date End Date Taking? Authorizing Provider  atorvastatin (LIPITOR) 80 MG tablet Take 1 tablet (80 mg total) by mouth daily. 05/30/18 05/30/19  Jenny Costa, MD  bumetanide (BUMEX) 1 MG tablet Take 1 tablet (1 mg total) by mouth daily. 05/30/18 05/30/19  Jenny Costa, MD  isosorbide mononitrate (IMDUR) 60 MG 24 hr tablet Take 1 tablet (60 mg total) by mouth daily. 05/30/18   Jenny Costa, MD   Allergies  Allergen Reactions  . Penicillins Rash   Review of Systems  Unable to perform ROS: Acuity of condition    Physical Exam Vitals signs and nursing note reviewed.   Constitutional:      Appearance: She is ill-appearing.     Comments: Makes but only briefly keeps eye contact, appears chronically ill and frail  HENT:     Head:     Comments: Some temporal wasting Cardiovascular:     Rate and Rhythm: Normal rate.  Pulmonary:     Effort: Pulmonary effort is normal. No accessory muscle usage or respiratory distress.  Abdominal:     Palpations: Abdomen is soft.  Musculoskeletal:     Right lower leg: No edema.     Left lower leg: No edema.  Skin:    General: Skin is warm and dry.  Neurological:     Mental Status: She is oriented to person, place, and time.  Psychiatric:     Comments: Flat affect, makes and only briefly keeps eye contact     Vital Signs: BP (!) 173/69 (BP Location: Right Arm)   Pulse 70   Temp 97.8 F (36.6 C) (Oral)   Resp 16   Ht 5\' 5"  (1.651 m)   Wt 64.8 kg   SpO2 93%   BMI 23.76 kg/m  Pain Scale: 0-10   Pain Score: 0-No pain   SpO2: SpO2: 93 % O2 Device:SpO2: 93 % O2 Flow Rate: .O2 Flow Rate (L/min): 2 L/min  IO: Intake/output summary:   Intake/Output Summary (Last 24 hours) at 05/30/2018 1143 Last data filed at 05/30/2018 1624 Gross per 24 hour  Intake 402.1 ml  Output 250 ml  Net 152.1 ml    LBM:   Baseline Weight: Weight: 64.2 kg Most recent weight: Weight: 64.8 kg     Palliative Assessment/Data:   Flowsheet Rows     Most Recent Value  Intake Tab  Referral Department  Hospitalist  Unit at Time of Referral  Cardiac/Telemetry Unit  Date Notified  05/29/18  Palliative Care Type  New Palliative care  Reason for referral  Clarify Goals of Care  Date of Admission  05/29/18  Date first seen by Palliative Care  05/30/18  # of days Palliative referral response time  1 Day(s)  # of days IP prior to Palliative referral  0  Clinical Assessment  Palliative Performance Scale Score  30%  Pain Max last 24 hours  Not able to report  Pain Min Last 24 hours  Not able to report  Dyspnea Max Last 24 Hours   Not able to report  Dyspnea Min Last 24 hours  Not able to report  Psychosocial & Spiritual Assessment  Palliative Care Outcomes      Time In: 1210 Time Out: 1245 Time Total: 35 minutes Greater than 50%  of this time was spent counseling and coordinating care related to the above assessment and plan.  Signed by: Drue Novel, NP   Please contact Palliative Medicine Team phone at [phone] for questions and concerns.  For individual provider: See Shea Evans

## 2018-05-30 NOTE — Evaluation (Signed)
Physical Therapy Evaluation Patient Details Name: Jenny [name] MRN: [id number] DOB: [birth year] Today's Date: 05/30/2018   History of Present Illness  Patient is a 62 year old female admitted for COPD exacerbation following c/o O2 desat.  PMH includes Htn, DM, CKD, CHF and anemia.  Clinical Impression  Pt is a 62 year old female who has been admitted from SNF.  She reports to have been working with PT and walking very little recently.  PT appears very ill and lethargic but agreeable to attempt bed mobility.  She was able to bring LE's to EOB and attempt sup to sit with hand held assist but stated she was too "weak" and laid back in bed.  Pt demonstrated understanding of supine exercises, reciting back to PT.  She was agreeable to education provided by PT concerning HEP and prevention of pressure ulcers.  Her O2 sats remained WNL throughout.  Pt will continue to benefit from skilled PT with focus on strength, tolerance to activity and safe functional mobility.  SNF setting is appropriate for discharge due to pt not functioning at baseline.    Follow Up Recommendations SNF    Equipment Recommendations  None recommended by PT    Recommendations for Other Services       Precautions / Restrictions Precautions Precautions: Fall Restrictions Weight Bearing Restrictions: No      Mobility  Bed Mobility Overal bed mobility: Needs Assistance Bed Mobility: Supine to Sit     Supine to sit: Mod assist     General bed mobility comments: Pt  was able to bring LE's to EOB and sit up partially with hand held assist.  She stated that she felt too weak and sick to move further at this time.  Transfers                    Ambulation/Gait                Stairs            Wheelchair Mobility    Modified Rankin (Stroke Patients Only)       Balance                                             Pertinent Vitals/Pain Pain Assessment: No/denies pain     Home Living Family/patient expects to be discharged to:: Skilled nursing facility                      Prior Function           Comments: When asked if she has been walking, pt replied "no".  Has been working with therapy on "getting in and out of the chair".     Hand Dominance        Extremity/Trunk Assessment   Upper Extremity Assessment Upper Extremity Assessment: Generalized weakness    Lower Extremity Assessment Lower Extremity Assessment: Generalized weakness    Cervical / Trunk Assessment Cervical / Trunk Assessment: Kyphotic  Communication   Communication: No difficulties  Cognition Arousal/Alertness: Lethargic Behavior During Therapy: Restless Overall Cognitive Status: Within Functional Limits for tasks assessed                                 General Comments: Pt appearing very  weak and slow to  respond to questions.  She did follow directions well but became tearful when she attempted bed mobility, stating that she felt "too weak" to get up.      General Comments      Exercises Other Exercises Other Exercises: Reviewed HEP to perform while in bed.  Pt recited back exercises and demonstrated such as ankle pumps, heel slides and pillow squeezes. x4 min Other Exercises: Assisted pt in positioning with pillows for prevention of pressure ulcers and educated pt concerning importance of frequent movement and avoidance of prolonged pressure on bony prominences. x4 min   Assessment/Plan    PT Assessment Patient needs continued PT services  PT Problem List Decreased strength;Decreased mobility;Decreased activity tolerance;Decreased balance;Decreased knowledge of use of DME       PT Treatment Interventions DME instruction;Functional mobility training;Balance training;Patient/family education;Gait training;Therapeutic activities;Stair training;Therapeutic exercise    PT Goals (Current goals can be found in the Care Plan section)   Acute Rehab PT Goals Patient Stated Goal: To regain strength and feel well enough to work with therapy. PT Goal Formulation: With patient Time For Goal Achievement: 06/13/18 Potential to Achieve Goals: Good    Frequency Min 2X/week   Barriers to discharge        Co-evaluation               AM-PAC PT "6 Clicks" Mobility  Outcome Measure Help needed turning from your back to your side while in a flat bed without using bedrails?: A Lot Help needed moving from lying on your back to sitting on the side of a flat bed without using bedrails?: A Lot Help needed moving to and from a bed to a chair (including a wheelchair)?: A Lot Help needed standing up from a chair using your arms (e.g., wheelchair or bedside chair)?: A Lot Help needed to walk in hospital room?: A Lot Help needed climbing 3-5 steps with a railing? : A Lot 6 Click Score: 12    End of Session Equipment Utilized During Treatment: Oxygen Activity Tolerance: Patient limited by fatigue;Patient limited by lethargy Patient left: in bed;with call bell/phone within reach   PT Visit Diagnosis: Muscle weakness (generalized) (M62.81)    Time: 6160-7371 PT Time Calculation (min) (ACUTE ONLY): 15 min   Charges:   PT Evaluation $PT Eval Low Complexity: 1 Low PT Treatments $Therapeutic Activity: 8-22 mins        Roxanne Gates, PT, DPT   Roxanne Gates 05/30/2018, 10:19 AM

## 2018-05-30 NOTE — Clinical Social Work Note (Signed)
Clinical Social Work Assessment  Patient Details  Name: Jenny [name] MRN: [id number] Date of Birth: 1955-11-30  Date of referral:  05/30/18               Reason for consult:  Facility Placement                Permission sought to share information with:  Facility Sport and exercise psychologist Permission granted to share information::  Yes, Verbal Permission Granted  Name::     Jenny [name]   [phone] or Jenny [name] Son   [phone]   Agency::  SNF admissions  Relationship::     Contact Information:     Housing/Transportation Living arrangements for the past 2 months:  [place], [place] of Information:  Patient, Medical Team Patient Interpreter Needed:  None Criminal Activity/Legal Involvement Pertinent to Current Situation/Hospitalization:  No - Comment as needed Significant Relationships:  Adult Children Lives with:  Self Do you feel safe going back to the place where you live?  No Need for family participation in patient care:  No (Coment)  Care giving concerns:  Patient states she does not have any concerns about returning back to SNF.   Social Worker assessment / plan:  Patient is a 62 year old female who is alert and oriented x4.  Patient has been receiving short term rehab at [facility], and has been there for a few days.  Patient states she is pleased with how things are going at [facility].  Patient was informed that she has been approved to return back to [facility] to continue with her therapy.  Patient did not express any questions or concerns about returning back to SNF.   Patient gave CSW permission to send clinical information to [facility].  Employment status:  Therapist, music:  Managed Care PT Recommendations:  [place] / Referral to community resources:  [place]  Patient/Family's Response to care:  Patient is in agreement to returning back to SNF.  Patient/Family's  Understanding of and Emotional Response to Diagnosis, Current Treatment, and Prognosis:  Patient expressed that she is hopeful that she can continue with her rehab and return home soon.  Emotional Assessment Appearance:  Appears older than stated age Attitude/Demeanor/Rapport:    Affect (typically observed):  Pleasant, Appropriate, Stable Orientation:  Oriented to Self, Oriented to Place, Oriented to  Time, Oriented to Situation Alcohol / Substance use:  Not Applicable Psych involvement (Current and /or in the community):  No (Comment)  Discharge Needs  Concerns to be addressed:  Care Coordination, Lack of Support Readmission within the last 30 days:  No Current discharge risk:  None Barriers to Discharge:  No Barriers Identified   Jenny [name] 05/30/2018, 4:39 PM

## 2018-05-30 NOTE — Progress Notes (Signed)
Patient discharged via ems. Son at bedside.

## 2018-05-30 NOTE — NC FL2 (Signed)
[place] LEVEL OF CARE SCREENING TOOL     IDENTIFICATION  Patient Name: Jenny [name] Birthdate: 1956/05/19 Sex: female Admission Date (Current Location): 05/29/2018  [place] and [place] Number:  Engineering geologist and Address:  [facility], [address] [place], [place] [address]      Provider Number: [id number]  Attending Physician Name and Address:  Bettey Costa, MD  Relative Name and Phone Number:  [name]   [phone] or     Current Level of Care: Hospital Recommended Level of Care: [place] Prior Approval Number:    Date Approved/Denied:   PASRR Number: [id number]  Discharge Plan: SNF    Current Diagnoses: Patient Active Problem List   Diagnosis Date Noted  . CHF (congestive heart failure) ([facility]) 05/29/2018    Orientation RESPIRATION BLADDER Height & Weight     Self, Time, Situation, Place  Normal Incontinent Weight: 142 lb 12.8 oz (64.8 kg) Height:  5\' 5"  (165.1 cm)  BEHAVIORAL SYMPTOMS/MOOD NEUROLOGICAL BOWEL NUTRITION STATUS      Continent Diet(Carb modified)  AMBULATORY STATUS COMMUNICATION OF NEEDS Skin   Limited Assist Verbally Normal, Other (Comment)                       Personal Care Assistance Level of Assistance  Feeding, Dressing, Bathing Bathing Assistance: Limited assistance Feeding assistance: Independent Dressing Assistance: Limited assistance     Functional Limitations Info  Sight, Speech, Hearing Sight Info: Adequate Hearing Info: Adequate Speech Info: Adequate    SPECIAL CARE FACTORS FREQUENCY  PT (By licensed PT), OT (By licensed OT)     PT Frequency: 5x a week OT Frequency: 5x a week            Contractures Contractures Info: Not present    Additional Factors Info  Code Status, Allergies, Insulin Sliding Scale Code Status Info: Full Code Allergies Info: PENICILLINS   Insulin Sliding Scale Info: insulin aspart (novoLOG) injection 0-9  Units 3x a day with meals       Current Medications (05/30/2018):  This is the current hospital active medication list Current Facility-Administered Medications  Medication Dose Route Frequency Provider Last Rate Last Dose  . acetaminophen (TYLENOL) tablet 650 mg  650 mg Oral Q6H PRN Hillary Bow, MD       Or  . acetaminophen (TYLENOL) suppository 650 mg  650 mg Rectal Q6H PRN Sudini, Srikar, MD      . albuterol (PROVENTIL) (2.5 MG/3ML) 0.083% nebulizer solution 2.5 mg  2.5 mg Nebulization Q2H PRN Sudini, Srikar, MD      . furosemide (LASIX) injection 80 mg  80 mg Intravenous BID Sudini, Srikar, MD      . hydrALAZINE (APRESOLINE) injection 10 mg  10 mg Intravenous Q6H PRN Sudini, Srikar, MD      . hydrALAZINE (APRESOLINE) tablet 25 mg  25 mg Oral BID Bettey Costa, MD   25 mg at 05/30/18 0839  . insulin aspart (novoLOG) injection 0-9 Units  0-9 Units Subcutaneous TID WC Hillary Bow, MD   3 Units at 05/30/18 [phone]  . ondansetron (ZOFRAN) tablet 4 mg  4 mg Oral Q6H PRN Sudini, Alveta Heimlich, MD       Or  . ondansetron (ZOFRAN) injection 4 mg  4 mg Intravenous Q6H PRN Sudini, Srikar, MD      . polyethylene glycol (MIRALAX / GLYCOLAX) packet 17 g  17 g Oral Daily PRN Hillary Bow, MD      .  sodium chloride flush (NS) 0.9 % injection 3 mL  3 mL Intravenous Q12H Hillary Bow, MD   3 mL at 05/30/18 0840     Discharge Medications: Please see discharge summary for a list of discharge medications.  Relevant Imaging Results:  Relevant Lab Results:   Additional Information SSN [id number]  Ross Ludwig, [place]

## 2018-05-30 NOTE — Clinical Social Work Note (Signed)
CSW received phone call from [facility] that patient has been approved to return to SNF.  CSW spoke to patient and she is in agreement to returning back to SNF.  Jones Broom. Norval Morton, MSW, [place]  05/30/2018 4:31 PM

## 2018-05-30 NOTE — Clinical Social Work Note (Signed)
CSW was informed that patient is from [facility] under short term rehab.  CSW to follow patient's progress throughout discharge planning, patient will need insurance reauthorization before she is ready to return back to SNF.  Jones Broom. Rodriguez Camp, MSW, [place]  05/30/2018 9:14 AM

## 2018-05-30 NOTE — Progress Notes (Signed)
   05/30/18 1505  Clinical Encounter Type  Visited With Patient  Visit Type Follow-up  Recommendations try again later today   Chaplain attempted follow up on order.  Patient appeared to be sleeping but did respond to chaplain's voice and requested that chaplain return at another time.

## 2018-05-31 LAB — HIV ANTIBODY (ROUTINE TESTING W REFLEX): HIV Screen 4th Generation wRfx: NONREACTIVE

## 2018-06-12 ENCOUNTER — Ambulatory Visit: Payer: BC Managed Care – PPO | Admitting: Family

## 2018-06-12 NOTE — Progress Notes (Deleted)
   Patient ID: Vivi Piccirilli, female    DOB: 26-Aug-1955, 63 y.o.   MRN: [id number]  HPI  Ms Rodenbaugh is a 63 y/o female with a history of  Echo report from 05/08/18 reviewed and showed an EF of 40-45% along with grade II diastolic dysfunction and mild/moderate TR.   Admitted 05/29/18 due to acute on chronic heart failure. Palliative care was consulted. Initially needed IV lasix and then transitioned to oral diuretics. Was able to be weaned back down to her 2L of oxygen. Has chronic anemia but is a Jehovah's witness so will not accept blood products. Discharged the following day. Admitted 05/07/18 due to hypothermia, bradycardia and hypotensive. She initially required bear hugger, bipap and multiple doses of atropine. Head CT was negative. Given antibiotics. Aggressively diuresed. Meds were adjusted due to hypotension and carvedilol was held but was able to be resumed. Discharged after 9 days.  She presents today for her initial visit with a chief complaint of   Review of Systems    Physical Exam    Assessment & Plan:  1: Chronic heart failure with mildly reduced EF- - NYHA class - pro- BNP 05/07/18 was 17,900  2: HTN- - BP - BMP 05/30/18 reviewed and showed sodium 146, potassium 4.5, creatinine 31.0 and GFR 18  3: DM- - A1c 05/19/18 was 5.5%  4: Anemia- - CBC 05/30/18 reviewed and showed hemoglobin of 6.5 - patient is [employer] witness so does not receive blood products

## 2018-06-26 ENCOUNTER — Inpatient Hospital Stay
Admission: EM | Admit: 2018-06-26 | Discharge: 2018-07-01 | DRG: 291 | Disposition: A | Discharge status: Skilled Nursing Facility | Payer: BC Managed Care – PPO | Source: Skilled Nursing Facility | Attending: Internal Medicine | Admitting: Internal Medicine

## 2018-06-26 ENCOUNTER — Emergency Department: Payer: BC Managed Care – PPO

## 2018-06-26 ENCOUNTER — Other Ambulatory Visit: Payer: Self-pay

## 2018-06-26 DIAGNOSIS — J9621 Acute and chronic respiratory failure with hypoxia: Secondary | ICD-10-CM | POA: Diagnosis present

## 2018-06-26 DIAGNOSIS — Z8249 Family history of ischemic heart disease and other diseases of the circulatory system: Secondary | ICD-10-CM | POA: Diagnosis not present

## 2018-06-26 DIAGNOSIS — N179 Acute kidney failure, unspecified: Secondary | ICD-10-CM | POA: Diagnosis present

## 2018-06-26 DIAGNOSIS — Z79899 Other long term (current) drug therapy: Secondary | ICD-10-CM

## 2018-06-26 DIAGNOSIS — I5043 Acute on chronic combined systolic (congestive) and diastolic (congestive) heart failure: Secondary | ICD-10-CM | POA: Diagnosis present

## 2018-06-26 DIAGNOSIS — D509 Iron deficiency anemia, unspecified: Secondary | ICD-10-CM | POA: Diagnosis present

## 2018-06-26 DIAGNOSIS — E1122 Type 2 diabetes mellitus with diabetic chronic kidney disease: Secondary | ICD-10-CM | POA: Diagnosis present

## 2018-06-26 DIAGNOSIS — Z9981 Dependence on supplemental oxygen: Secondary | ICD-10-CM

## 2018-06-26 DIAGNOSIS — N184 Chronic kidney disease, stage 4 (severe): Secondary | ICD-10-CM | POA: Diagnosis present

## 2018-06-26 DIAGNOSIS — J90 Pleural effusion, not elsewhere classified: Secondary | ICD-10-CM

## 2018-06-26 DIAGNOSIS — D539 Nutritional anemia, unspecified: Secondary | ICD-10-CM | POA: Diagnosis present

## 2018-06-26 DIAGNOSIS — R0902 Hypoxemia: Secondary | ICD-10-CM

## 2018-06-26 DIAGNOSIS — J441 Chronic obstructive pulmonary disease with (acute) exacerbation: Secondary | ICD-10-CM | POA: Diagnosis present

## 2018-06-26 DIAGNOSIS — N189 Chronic kidney disease, unspecified: Secondary | ICD-10-CM

## 2018-06-26 DIAGNOSIS — E875 Hyperkalemia: Secondary | ICD-10-CM | POA: Diagnosis present

## 2018-06-26 DIAGNOSIS — I509 Heart failure, unspecified: Secondary | ICD-10-CM

## 2018-06-26 DIAGNOSIS — R0602 Shortness of breath: Secondary | ICD-10-CM | POA: Diagnosis present

## 2018-06-26 DIAGNOSIS — I130 Hypertensive heart and chronic kidney disease with heart failure and stage 1 through stage 4 chronic kidney disease, or unspecified chronic kidney disease: Principal | ICD-10-CM | POA: Diagnosis present

## 2018-06-26 DIAGNOSIS — Z87891 Personal history of nicotine dependence: Secondary | ICD-10-CM

## 2018-06-26 DIAGNOSIS — I252 Old myocardial infarction: Secondary | ICD-10-CM | POA: Diagnosis not present

## 2018-06-26 DIAGNOSIS — I160 Hypertensive urgency: Secondary | ICD-10-CM | POA: Diagnosis present

## 2018-06-26 DIAGNOSIS — J9690 Respiratory failure, unspecified, unspecified whether with hypoxia or hypercapnia: Secondary | ICD-10-CM | POA: Diagnosis present

## 2018-06-26 LAB — CBC WITH DIFFERENTIAL/PLATELET
Abs Immature Granulocytes: 0.08 10*3/uL — ABNORMAL HIGH (ref 0.00–0.07)
Basophils Absolute: 0.1 10*3/uL (ref 0.0–0.1)
Basophils Relative: 1 %
EOS ABS: 0.3 10*3/uL (ref 0.0–0.5)
Eosinophils Relative: 3 %
HEMATOCRIT: 25.4 % — AB (ref 36.0–46.0)
Hemoglobin: 7.2 g/dL — ABNORMAL LOW (ref 12.0–15.0)
Immature Granulocytes: 1 %
Lymphocytes Relative: 22 %
Lymphs Abs: 2.2 10*3/uL (ref 0.7–4.0)
MCH: 28.8 pg (ref 26.0–34.0)
MCHC: 28.3 g/dL — ABNORMAL LOW (ref 30.0–36.0)
MCV: 101.6 fL — ABNORMAL HIGH (ref 80.0–100.0)
Monocytes Absolute: 0.8 10*3/uL (ref 0.1–1.0)
Monocytes Relative: 8 %
Neutro Abs: 6.7 10*3/uL (ref 1.7–7.7)
Neutrophils Relative %: 65 %
Platelets: 358 10*3/uL (ref 150–400)
RBC: 2.5 MIL/uL — ABNORMAL LOW (ref 3.87–5.11)
RDW: 21.9 % — AB (ref 11.5–15.5)
WBC: 10.1 10*3/uL (ref 4.0–10.5)
nRBC: 0.3 % — ABNORMAL HIGH (ref 0.0–0.2)

## 2018-06-26 LAB — COMPREHENSIVE METABOLIC PANEL
ALBUMIN: 2.1 g/dL — AB (ref 3.5–5.0)
ALT: 28 U/L (ref 0–44)
AST: 19 U/L (ref 15–41)
Alkaline Phosphatase: 105 U/L (ref 38–126)
Anion gap: 7 (ref 5–15)
BUN: 69 mg/dL — ABNORMAL HIGH (ref 8–23)
CO2: 20 mmol/L — AB (ref 22–32)
Calcium: 7.1 mg/dL — ABNORMAL LOW (ref 8.9–10.3)
Chloride: 115 mmol/L — ABNORMAL HIGH (ref 98–111)
Creatinine, Ser: 3.55 mg/dL — ABNORMAL HIGH (ref 0.44–1.00)
GFR calc Af Amer: 15 mL/min — ABNORMAL LOW (ref >60)
GFR calc non Af Amer: 13 mL/min — ABNORMAL LOW (ref >60)
Glucose, Bld: 196 mg/dL — ABNORMAL HIGH (ref 70–99)
Potassium: 6 mmol/L — ABNORMAL HIGH (ref 3.5–5.1)
Sodium: 142 mmol/L (ref 135–145)
Total Bilirubin: 0.5 mg/dL (ref 0.3–1.2)
Total Protein: 5.3 g/dL — ABNORMAL LOW (ref 6.5–8.1)

## 2018-06-26 LAB — BLOOD GAS, ARTERIAL
Acid-base deficit: 5.9 mmol/L — ABNORMAL HIGH (ref 0.0–2.0)
Bicarbonate: 18.6 mmol/L — ABNORMAL LOW (ref 20.0–28.0)
FIO2: 0.28
O2 Saturation: 89 %
Patient temperature: 37
pCO2 arterial: 33 mmHg (ref 32.0–48.0)
pH, Arterial: 7.36 (ref 7.350–7.450)
pO2, Arterial: 59 mmHg — ABNORMAL LOW (ref 83.0–108.0)

## 2018-06-26 LAB — TROPONIN I
Troponin I: 0.04 ng/mL (ref <0.03)
Troponin I: 0.04 ng/mL (ref <0.03)

## 2018-06-26 LAB — TSH: TSH: 3.107 u[IU]/mL (ref 0.350–4.500)

## 2018-06-26 LAB — GLUCOSE, CAPILLARY: Glucose-Capillary: 107 mg/dL — ABNORMAL HIGH (ref 70–99)

## 2018-06-26 LAB — BRAIN NATRIURETIC PEPTIDE: B Natriuretic Peptide: 3002 pg/mL — ABNORMAL HIGH (ref 0.0–100.0)

## 2018-06-26 MED ORDER — ISOSORBIDE MONONITRATE ER 60 MG PO TB24
60.0000 mg | ORAL_TABLET | Freq: Every day | ORAL | Status: DC
  Administered 2018-06-27 – 2018-07-01 (×5): 60 mg via ORAL
  Filled 2018-06-26 (×5): qty 1

## 2018-06-26 MED ORDER — CARVEDILOL 25 MG PO TABS
25.0000 mg | ORAL_TABLET | Freq: Two times a day (BID) | ORAL | Status: DC
  Administered 2018-06-26 – 2018-07-01 (×10): 25 mg via ORAL
  Filled 2018-06-26 (×10): qty 1

## 2018-06-26 MED ORDER — ATORVASTATIN CALCIUM 20 MG PO TABS
80.0000 mg | ORAL_TABLET | Freq: Every day | ORAL | Status: DC
  Administered 2018-06-26 – 2018-07-01 (×6): 80 mg via ORAL
  Filled 2018-06-26 (×6): qty 4

## 2018-06-26 MED ORDER — HEPARIN SODIUM (PORCINE) 5000 UNIT/ML IJ SOLN
5000.0000 [IU] | Freq: Three times a day (TID) | INTRAMUSCULAR | Status: DC
  Administered 2018-06-26 – 2018-07-01 (×13): 5000 [IU] via SUBCUTANEOUS
  Filled 2018-06-26 (×14): qty 1

## 2018-06-26 MED ORDER — LOPERAMIDE HCL 2 MG PO CAPS
2.0000 mg | ORAL_CAPSULE | Freq: Four times a day (QID) | ORAL | Status: DC | PRN
  Administered 2018-06-26 – 2018-06-28 (×3): 2 mg via ORAL
  Filled 2018-06-26 (×3): qty 1

## 2018-06-26 MED ORDER — DEXTROSE 50 % IV SOLN
1.0000 | Freq: Once | INTRAVENOUS | Status: AC
  Administered 2018-06-26: 50 mL via INTRAVENOUS
  Filled 2018-06-26: qty 50

## 2018-06-26 MED ORDER — INSULIN ASPART 100 UNIT/ML ~~LOC~~ SOLN
0.0000 [IU] | Freq: Every day | SUBCUTANEOUS | Status: DC
  Administered 2018-06-27: 4 [IU] via SUBCUTANEOUS
  Administered 2018-06-28: 5 [IU] via SUBCUTANEOUS
  Administered 2018-06-29: 3 [IU] via SUBCUTANEOUS
  Administered 2018-06-30: 4 [IU] via SUBCUTANEOUS
  Filled 2018-06-26 (×4): qty 1

## 2018-06-26 MED ORDER — POLYETHYLENE GLYCOL 3350 17 G PO PACK: 17.0000 g | PACK | Freq: Every day | ORAL | Status: DC | PRN

## 2018-06-26 MED ORDER — HYDROCODONE-ACETAMINOPHEN 5-325 MG PO TABS
1.0000 | ORAL_TABLET | ORAL | Status: DC | PRN
  Administered 2018-06-30: 1 via ORAL
  Filled 2018-06-26: qty 1

## 2018-06-26 MED ORDER — PATIROMER SORBITEX CALCIUM 8.4 G PO PACK
8.4000 g | PACK | Freq: Once | ORAL | Status: DC
  Filled 2018-06-26: qty 1

## 2018-06-26 MED ORDER — FUROSEMIDE 10 MG/ML IJ SOLN
80.0000 mg | Freq: Once | INTRAMUSCULAR | Status: AC
  Administered 2018-06-26: 80 mg via INTRAVENOUS
  Filled 2018-06-26: qty 8

## 2018-06-26 MED ORDER — METHYLPREDNISOLONE SODIUM SUCC 125 MG IJ SOLR
60.0000 mg | Freq: Four times a day (QID) | INTRAMUSCULAR | Status: DC
  Administered 2018-06-26 – 2018-06-27 (×3): 60 mg via INTRAVENOUS
  Filled 2018-06-26 (×3): qty 2

## 2018-06-26 MED ORDER — FUROSEMIDE 10 MG/ML IJ SOLN
40.0000 mg | Freq: Two times a day (BID) | INTRAMUSCULAR | Status: DC
  Administered 2018-06-27 – 2018-06-28 (×4): 40 mg via INTRAVENOUS
  Filled 2018-06-26 (×4): qty 4

## 2018-06-26 MED ORDER — ONDANSETRON HCL 4 MG PO TABS: 4.0000 mg | ORAL_TABLET | Freq: Four times a day (QID) | ORAL | Status: DC | PRN

## 2018-06-26 MED ORDER — VITAMIN D3 25 MCG (1000 UNIT) PO TABS
5000.0000 [IU] | ORAL_TABLET | Freq: Every day | ORAL | Status: DC
  Administered 2018-06-27 – 2018-07-01 (×5): 5000 [IU] via ORAL
  Filled 2018-06-26 (×5): qty 5

## 2018-06-26 MED ORDER — ACETAMINOPHEN 325 MG PO TABS: 650.0000 mg | ORAL_TABLET | Freq: Four times a day (QID) | ORAL | Status: DC | PRN

## 2018-06-26 MED ORDER — ACETAMINOPHEN 650 MG RE SUPP: 650.0000 mg | Freq: Four times a day (QID) | RECTAL | Status: DC | PRN

## 2018-06-26 MED ORDER — CALCIUM GLUCONATE 10 % IV SOLN
1.0000 g | Freq: Once | INTRAVENOUS | Status: AC
  Administered 2018-06-26: 1 g via INTRAVENOUS
  Filled 2018-06-26: qty 10

## 2018-06-26 MED ORDER — ONDANSETRON HCL 4 MG/2ML IJ SOLN: 4.0000 mg | Freq: Four times a day (QID) | INTRAMUSCULAR | Status: DC | PRN

## 2018-06-26 MED ORDER — INSULIN ASPART 100 UNIT/ML ~~LOC~~ SOLN
10.0000 [IU] | Freq: Once | SUBCUTANEOUS | Status: AC
  Administered 2018-06-26: 10 [IU] via INTRAVENOUS
  Filled 2018-06-26: qty 1

## 2018-06-26 MED ORDER — AMLODIPINE BESYLATE 5 MG PO TABS
5.0000 mg | ORAL_TABLET | Freq: Every day | ORAL | Status: DC
  Administered 2018-06-27: 5 mg via ORAL
  Filled 2018-06-26: qty 1

## 2018-06-26 MED ORDER — SODIUM CHLORIDE 0.9 % IV SOLN
100.0000 mg | Freq: Two times a day (BID) | INTRAVENOUS | Status: DC
  Administered 2018-06-26: 100 mg via INTRAVENOUS
  Filled 2018-06-26 (×4): qty 100

## 2018-06-26 MED ORDER — INSULIN ASPART 100 UNIT/ML ~~LOC~~ SOLN
0.0000 [IU] | Freq: Three times a day (TID) | SUBCUTANEOUS | Status: DC
  Administered 2018-06-27: 8 [IU] via SUBCUTANEOUS
  Administered 2018-06-27: 5 [IU] via SUBCUTANEOUS
  Administered 2018-06-27 – 2018-06-28 (×3): 8 [IU] via SUBCUTANEOUS
  Administered 2018-06-28 – 2018-06-29 (×3): 11 [IU] via SUBCUTANEOUS
  Administered 2018-06-29 – 2018-06-30 (×2): 8 [IU] via SUBCUTANEOUS
  Administered 2018-06-30 (×2): 3 [IU] via SUBCUTANEOUS
  Administered 2018-07-01: 5 [IU] via SUBCUTANEOUS
  Administered 2018-07-01: 8 [IU] via SUBCUTANEOUS
  Administered 2018-07-01: 3 [IU] via SUBCUTANEOUS
  Filled 2018-06-26 (×14): qty 1

## 2018-06-26 MED ORDER — IPRATROPIUM-ALBUTEROL 0.5-2.5 (3) MG/3ML IN SOLN
3.0000 mL | Freq: Four times a day (QID) | RESPIRATORY_TRACT | Status: DC
  Administered 2018-06-26 – 2018-06-27 (×3): 3 mL via RESPIRATORY_TRACT
  Filled 2018-06-26 (×2): qty 3

## 2018-06-26 MED ORDER — HYDRALAZINE HCL 20 MG/ML IJ SOLN: 10.0000 mg | INTRAMUSCULAR | Status: DC | PRN

## 2018-06-26 NOTE — ED Notes (Signed)
Pt taken off bipap. ?

## 2018-06-26 NOTE — ED Provider Notes (Signed)
[facility] Emergency Department Provider Note  ____________________________________________   None    (approximate)  I have reviewed the triage vital signs and the nursing notes.   HISTORY  Chief Complaint Shortness of Breath   HPI Jenny [name] is a 63 y.o. female with a history of CHF as well as CKD diabetes who was presented emergency department today with shortness of breath that started this afternoon.  She is denying any pain.  Says that she is a productive cough with clear sputum.  Denies any worsening swelling to her bilateral lower extremities.  EMS reports normal oxygen saturations with a breathing treatment in route.  However, in the room at this time the patient is 82% on the monitor.    Past Medical History:  Diagnosis Date  . Anemia   . CHF (congestive heart failure) ([place])   . CKD (chronic kidney disease) stage 4, GFR 15-29 ml/min ([facility])   . Diabetes mellitus without complication ([place])   . Hypertension   . Patient is [employer] Witness     Patient Active Problem List   Diagnosis Date Noted  . Goals of care, counseling/discussion   . Palliative care by specialist   . DNR (do not resuscitate) discussion   . CHF (congestive heart failure) ([place]) 05/29/2018    History reviewed. No pertinent surgical history.  Prior to Admission medications   Medication Sig Start Date End Date Taking? Authorizing Provider  aspirin 81 MG chewable tablet Chew 1 tablet by mouth daily. 04/06/18   [provider]  atorvastatin (LIPITOR) 80 MG tablet Take 1 tablet (80 mg total) by mouth daily. 05/30/18 05/30/19  Bettey Costa, MD  bumetanide (BUMEX) 1 MG tablet Take 1 tablet (1 mg total) by mouth daily. 05/30/18 05/30/19  Bettey Costa, MD  calcium carbonate (OS-CAL) 600 MG tablet Take 1 tablet by mouth daily. 04/19/18   [provider]  carvedilol (COREG) 25 MG tablet Take 1 tablet by mouth 2 (two) times daily. 05/16/18 06/15/18  [provider]  isosorbide mononitrate (IMDUR) 60 MG 24 hr tablet Take 1 tablet (60 mg total) by mouth daily. 05/30/18   Bettey Costa, MD  Vitamin D, Ergocalciferol, (DRISDOL) 1.25 MG (50000 UT) CAPS capsule Take 1 capsule by mouth once a week. 04/23/18 04/23/19  [provider]    Allergies Penicillins  No family history on file.  Social History Social History   Tobacco Use  . Smoking status: Former Research scientist (life sciences)  . Smokeless tobacco: Never Used  Substance Use Topics  . Alcohol use: Not Currently  . Drug use: Never    Review of Systems  Constitutional: No fever/chills Eyes: No visual changes. ENT: No sore throat. Cardiovascular: Denies chest pain. Respiratory: As above Gastrointestinal: No abdominal pain.  No nausea, no vomiting.  No diarrhea.  No constipation. Genitourinary: Negative for dysuria. Musculoskeletal: Negative for back pain. Skin: Negative for rash. Neurological: Negative for headaches, focal weakness or numbness.   ____________________________________________   PHYSICAL EXAM:  VITAL SIGNS: ED Triage Vitals  Enc Vitals Group     BP 06/26/18 1809 (!) 171/104     Pulse Rate 06/26/18 1809 67     Resp 06/26/18 1809 (!) 25     Temp 06/26/18 1809 97.8 F (36.6 C)     Temp Source 06/26/18 1809 Oral     SpO2 06/26/18 1809 (!) 82 %     Weight 06/26/18 1807 130 lb (59 kg)     Height 06/26/18 1807 5\' 5"  (1.651  m)     Head Circumference --      [facility] Flow --      Pain Score 06/26/18 1805 0     Pain Loc --      Pain Edu? --      Excl. in [place]? --     Constitutional: Alert and oriented.  Labored respirations with supraclavicular retractions. Eyes: Conjunctivae are normal.  Head: Atraumatic. Nose: No congestion/rhinnorhea. Mouth/Throat: Mucous membranes are moist.  Neck: No stridor.   Cardiovascular: Normal rate, regular rhythm. Grossly normal heart sounds.   Respiratory: Able to speak in 3-4 word sentences but with labored respirations with  supraclavicular retractions and rales especially to the right base greater than the left.  Upper fields are clear.  No wheezing auscultated. Gastrointestinal: Soft and nontender. No distention.  Musculoskeletal: Moderate bilateral lower extremity edema present.   Neurologic:  Normal speech and language. No gross focal neurologic deficits are appreciated. Skin:  Skin is warm, dry and intact. No rash noted. Psychiatric: Mood and affect are normal. Speech and behavior are normal.  ____________________________________________   LABS (all labs ordered are listed, but only abnormal results are displayed)  Labs Reviewed  CBC WITH DIFFERENTIAL/PLATELET  COMPREHENSIVE METABOLIC PANEL  TROPONIN I  BRAIN NATRIURETIC PEPTIDE   ____________________________________________  EKG  ED ECG REPORT I, Doran Stabler, the attending physician, personally viewed and interpreted this ECG.   Date: 06/26/2018  EKG Time: 1813  Rate: 90  Rhythm: normal sinus rhythm  Axis: Normal  Intervals:Prolonged QTC at 505  ST&T Change: No ST segment elevation or depression.  No abnormal T wave inversion.  ____________________________________________  RADIOLOGY  Worsening opacification of the left mid to lower lung and right base suggesting moderate left effusion and small right effusion with associated bibasilar atelectasis.  Suggestion of mild interstitial edema. ____________________________________________   PROCEDURES  Procedure(s) performed:   .Critical Care Performed by: Orbie Pyo, MD Authorized by: Orbie Pyo, MD   Critical care provider statement:    Critical care time (minutes):  35   Critical care time was exclusive of:  Separately billable procedures and treating other patients   Critical care was necessary to treat or prevent imminent or life-threatening deterioration of the following conditions:  Respiratory failure   Critical care was time spent personally by  me on the following activities:  Development of treatment plan with patient or surrogate, discussions with consultants, evaluation of patient's response to treatment, examination of patient, obtaining history from patient or surrogate, ordering and performing treatments and interventions, ordering and review of laboratory studies, ordering and review of radiographic studies, pulse oximetry, re-evaluation of patient's condition and review of old charts    Critical Care performed:    ____________________________________________   INITIAL IMPRESSION / ASSESSMENT AND PLAN / ED COURSE  Pertinent labs & imaging results that were available during my care of the patient were reviewed by me and considered in my medical decision making (see chart for details).  Differential includes, but is not limited to, viral syndrome, bronchitis including COPD exacerbation, pneumonia, reactive airway disease including asthma, CHF including exacerbation with or without pulmonary/interstitial edema, pneumothorax, ACS, thoracic trauma, and pulmonary embolism. As part of my medical decision making, I reviewed the following data within the electronic MEDICAL RECORD NUMBER Notes from prior ED visits  Respiratory called for BiPAP.  Differential includes, but is not limited to, viral syndrome, bronchitis including COPD exacerbation, pneumonia, reactive airway disease including asthma, CHF including exacerbation with or without pulmonary/interstitial  edema, pneumothorax, ACS, thoracic trauma, and pulmonary embolism. As part of my medical decision making, I reviewed the following data within the electronic MEDICAL RECORD NUMBER Notes from prior ED visits  ----------------------------------------- 7:21 PM on 06/26/2018 -----------------------------------------  Patient tolerating the BiPAP well.  Likely CHF exacerbation given high blood pressure as well as chest x-ray.  Given IV Lasix.  Started on BiPAP and is tolerating well and  feels improved.  Also with elevated potassium and slightly decreased renal function.  Patient ordered calcium as well as insulin and D50 in addition to [place].  Patient to be admitted to the hospital.  Signed out to Dr. Jerelyn Charles.  Patient aware of diagnosis well treatment and willing to comply. ____________________________________________   FINAL CLINICAL IMPRESSION(S) / ED DIAGNOSES  Hyperkalemia.  CHF.  Acute on chronic renal failure.   Hypoxia  NEW MEDICATIONS STARTED DURING THIS VISIT:  New Prescriptions   No medications on file     Note:  This document was prepared using Dragon voice recognition software and may include unintentional dictation errors.     Orbie Pyo, MD 06/26/18 [place]

## 2018-06-26 NOTE — H&P (Signed)
[facility] [place] NAME: Jenny [name]    MR#:  [id number]  DATE OF BIRTH:  1955/12/29  DATE OF ADMISSION:  06/26/2018  PRIMARY CARE PHYSICIAN: Juluis Pitch, MD   REQUESTING/REFERRING PHYSICIAN:   CHIEF COMPLAINT:   Chief Complaint  Patient presents with  . Shortness of Breath    HISTORY OF PRESENT ILLNESS: Jenny [name]  is a 63 y.o. female with a known history per below, presenting from local nursing home with acute shortness of breath, on 2 L chronically-noted to be 4 L on presentation satting 82%, complaining of productive cough for last several days, worsening lower extremity swelling, placed on BiPAP in the emergency room, noted potassium of 6, creatinine 3.5 up from 3.1 baseline, BNP greater than 3000, hemoglobin 7.2 up from 6.5, chest x-ray noted for questionable interstitial edema, hospitalist asked to evaluate given need for BiPAP, patient evaluated at the bedside, patient no apparent distress, resting comfortably in bed on BiPAP, patient is now been admitted for acute on chronic hypoxic respiratory failure suspected secondary to acute on COPD exacerbation, left-sided moderate pleural effusion, and probable element of mild acute on chronic systolic congestive heart failure.  PAST MEDICAL HISTORY:   Past Medical History:  Diagnosis Date  . Anemia   . CHF (congestive heart failure) ([place])   . CKD (chronic kidney disease) stage 4, GFR 15-29 ml/min (HCC)   . Diabetes mellitus without complication ([place])   . Hypertension   . Patient is Jehovah's Witness     PAST SURGICAL HISTORY:  None  SOCIAL HISTORY:  Social History   Tobacco Use  . Smoking status: Former Research scientist (life sciences)  . Smokeless tobacco: Never Used  Substance Use Topics  . Alcohol use: Not Currently    FAMILY HISTORY:  HTN  DRUG ALLERGIES:  Allergies  Allergen Reactions  . Penicillins Rash    REVIEW OF SYSTEMS:   CONSTITUTIONAL: No fever, + fatigue, weakness.  EYES:  No blurred or double vision.  EARS, NOSE, AND THROAT: No tinnitus or ear pain.  RESPIRATORY: + cough, shortness of breath, wheezing  CARDIOVASCULAR: No chest pain, orthopnea, edema.  GASTROINTESTINAL: No nausea, vomiting, diarrhea or abdominal pain.  GENITOURINARY: No dysuria, hematuria.  ENDOCRINE: No polyuria, nocturia,  HEMATOLOGY: No anemia, easy bruising or bleeding SKIN: No rash or lesion. MUSCULOSKELETAL: No joint pain or arthritis.   NEUROLOGIC: No tingling, numbness, weakness.  PSYCHIATRY: No anxiety or depression.   MEDICATIONS AT HOME:  Prior to Admission medications   Medication Sig Start Date End Date Taking? Authorizing Provider  amLODipine (NORVASC) 2.5 MG tablet Take 5 mg by mouth daily.   Yes [provider]  atorvastatin (LIPITOR) 80 MG tablet Take 1 tablet (80 mg total) by mouth daily. 05/30/18 05/30/19 Yes Mody, Sital, MD  bumetanide (BUMEX) 1 MG tablet Take 1 tablet (1 mg total) by mouth daily. Patient taking differently: Take 1 mg by mouth 2 (two) times daily.  05/30/18 05/30/19 Yes Mody, Ulice Bold, MD  carvedilol (COREG) 25 MG tablet Take 1 tablet by mouth 2 (two) times daily. 05/16/18 06/26/18 Yes [provider]  cholecalciferol (VITAMIN D) 25 MCG (1000 UT) tablet Take 5,000 Units by mouth daily.   Yes [provider]  dextromethorphan-guaiFENesin (MUCINEX DM) 30-600 MG 12hr tablet Take 1 tablet by mouth 2 (two) times daily as needed for cough.   Yes [provider]  epoetin alfa-epbx (RETACRIT) 51700 UNIT/ML injection 15,000 Units every Friday.   Yes [provider]  isosorbide mononitrate (IMDUR) 60 MG 24 hr tablet Take 1 tablet (60 mg total) by mouth daily. 05/30/18  Yes Bettey Costa, MD  loperamide (IMODIUM) 2 MG capsule Take 2 mg by mouth every 6 (six) hours as needed for diarrhea or loose stools.   Yes [provider]      PHYSICAL EXAMINATION:   VITAL SIGNS: Blood pressure (!) 164/83, pulse 67, temperature  97.8 F (36.6 C), temperature source Oral, resp. rate (!) 22, height 5\' 5"  (1.651 m), weight 59 kg, SpO2 98 %.  GENERAL:  63 y.o.-year-old patient lying in the bed with no acute distress.  Frail-appearing EYES: Pupils equal, round, reactive to light and accommodation. No scleral icterus. Extraocular muscles intact.  HEENT: Head atraumatic, normocephalic. Oropharynx and nasopharynx clear.  NECK:  Supple, no jugular venous distention. No thyroid enlargement, no tenderness.  LUNGS: Severely diminished breath sounds with rhonchi bilaterally. No use of accessory muscles of respiration.  CARDIOVASCULAR: S1, S2 normal. No murmurs, rubs, or gallops.  ABDOMEN: Soft, nontender, nondistended. Bowel sounds present. No organomegaly or mass.  EXTREMITIES: No pedal edema, cyanosis, or clubbing.  NEUROLOGIC: Cranial nerves II through XII are intact. Muscle strength 5/5 in all extremities. Sensation intact. Gait not checked.  PSYCHIATRIC: The patient is alert and oriented x 3.  SKIN: No obvious rash, lesion, or ulcer.   LABORATORY PANEL:   CBC Recent Labs  Lab 06/26/18 1808  WBC 10.1  HGB 7.2*  HCT 25.4*  PLT 358  MCV 101.6*  MCH 28.8  MCHC 28.3*  RDW 21.9*  LYMPHSABS 2.2  MONOABS 0.8  EOSABS 0.3  BASOSABS 0.1   ------------------------------------------------------------------------------------------------------------------  Chemistries  Recent Labs  Lab 06/26/18 1808  NA 142  K 6.0*  CL 115*  CO2 20*  GLUCOSE 196*  BUN 69*  CREATININE 3.55*  CALCIUM 7.1*  AST 19  ALT 28  ALKPHOS 105  BILITOT 0.5   ------------------------------------------------------------------------------------------------------------------ estimated creatinine clearance is 14.6 mL/min (A) (by C-G formula based on SCr of 3.55 mg/dL (H)). ------------------------------------------------------------------------------------------------------------------ No results for input(s): TSH, T4TOTAL, T3FREE, THYROIDAB  in the last 72 hours.  Invalid input(s): FREET3   Coagulation profile No results for input(s): INR, PROTIME in the last 168 hours. ------------------------------------------------------------------------------------------------------------------- No results for input(s): DDIMER in the last 72 hours. -------------------------------------------------------------------------------------------------------------------  Cardiac Enzymes Recent Labs  Lab 06/26/18 1808  TROPONINI 0.04*   ------------------------------------------------------------------------------------------------------------------ Invalid input(s): POCBNP  ---------------------------------------------------------------------------------------------------------------  Urinalysis No results found for: COLORURINE, APPEARANCEUR, LABSPEC, PHURINE, GLUCOSEU, HGBUR, BILIRUBINUR, KETONESUR, PROTEINUR, UROBILINOGEN, NITRITE, LEUKOCYTESUR   RADIOLOGY: Dg Chest 1 View  Result Date: 06/26/2018 CLINICAL DATA:  Increase shortness-of-breath 1 day. EXAM: CHEST  1 VIEW COMPARISON:  05/29/2018 and 04/05/2018 FINDINGS: Lungs are somewhat hypoinflated with worsening opacification over the right base and left mid to lower lung likely worsening moderate size left effusion and small right effusion with associated bibasilar atelectasis. Infection in the lung bases is possible. There is hazy prominence of the perihilar markings suggesting mild interstitial edema. Remainder of the exam is unchanged. IMPRESSION: Worsening opacification over the left mid to lower lung and right base suggesting moderate left effusion and small right effusion with associated bibasilar atelectasis. Infection in the lung bases is possible. Suggestion mild interstitial edema. Electronically Signed   By: Marin Olp M.D.   On: 06/26/2018 18:46    EKG: Orders placed or performed during the hospital encounter of 06/26/18  . EKG 12-Lead  . 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%, MGUS, chronic kidney disease stage IV, Jehovah's Witness and hypertension who presented to the emergency room with shortness of breath from nursing home  *Acute on chronic hypoxic respiratory failure  Secondary to multifactorial process that includes acute on COPD exacerbation > left-sided moderate pleural effusion >> acute on chronic systolic congestive heart failure exacerbation Admit to telemetry bed given patient successfully weaned off BiPAP, supplemental oxygen with weaning as tolerated-on 2 L chronically at the nursing home   *Acute on COPD exacerbation  IV Solu-Medrol with tapering as tolerated, aggressive pulmonary toilet bronchodilator therapy, inhaled corticosteroids twice daily, mucolytic agents, empiric doxycycline, respiratory therapy following, check sputum cultures, supplemental oxygen weaning as stated above   *Acute left moderate pleural effusion  Interventional radiology consulted for thoracentesis   *Acute on chronic systolic congestive heart failure exacerbation, minimal to mild  Ejection fraction 40-45% Case compounded by worsening chronic kidney disease stage IV IV Lasix twice daily, strict I&O monitoring, daily weights, nephrology to see  *Chronic Jehovah's Witness Hemoglobin 7.2, up from 6.5 Conservative medical management  *AKI with chronic kidney disease stage IV baseline creatinine around 3.0 Nephrology consulted for expert opinion  *Essential hypertension Stable Continue home regiment  All the records are reviewed and case discussed with ED provider. Management plans discussed with the patient, family and they are in agreement.  CODE STATUS:full Code Status History    Date Active Date Inactive Code Status Order ID Comments User Context   05/29/2018 2023 05/30/2018 2034 Full Code [id number]  Hillary Bow, MD ED       TOTAL  TIME TAKING CARE OF THIS PATIENT: 40 minutes.    Jenny [name] Jenny [name] M.D on 06/26/2018   Between 7am to 6pm - Pager - [phone]  After 6pm go to [email or web address] - password [email or web address] [facility]  Office  [phone]  CC: Primary care physician; Juluis Pitch, MD   Note: This dictation was prepared with Dragon dictation along with smaller phrase technology. Any transcriptional errors that result from this process are unintentional.

## 2018-06-26 NOTE — Progress Notes (Signed)
Family Meeting Note  Advance Directive:yes  Today a meeting took place with the Patient.  Patient is able to participate   The following clinical team members were present during this meeting:MD  The following were discussed:Patient's diagnosis: Respiratory failure, congestive heart failure, COPD, Patient's progosis: Unable to determine and Goals for treatment: Full Code  Additional follow-up to be provided: prn  Time spent during discussion:20 minutes  Gorden Harms, MD

## 2018-06-26 NOTE — Progress Notes (Signed)
RT to patient bedside for ABG draw. MD at bedside. Per MD verbal order, patient taken off of Bipap and placed on 2L [place]. Patient is 2L Chronic O2 at home. ABG obtained. No critical values to report.

## 2018-06-26 NOTE — ED Triage Notes (Signed)
Pt to ED via EMS from [facility]. Pt c/o increased SOB x1day. Pt has hx of copd and chf. Pt states she feels like chf exacerbation. Pt on 2L chronically. Pt arrives on 4L sats at 82%.

## 2018-06-26 NOTE — ED Notes (Signed)
Pt placed on Bipap at this time.

## 2018-06-27 ENCOUNTER — Inpatient Hospital Stay: Payer: BC Managed Care – PPO

## 2018-06-27 ENCOUNTER — Other Ambulatory Visit: Payer: Self-pay

## 2018-06-27 LAB — BASIC METABOLIC PANEL
Anion gap: 9 (ref 5–15)
BUN: 71 mg/dL — ABNORMAL HIGH (ref 8–23)
CO2: 20 mmol/L — ABNORMAL LOW (ref 22–32)
Calcium: 7.4 mg/dL — ABNORMAL LOW (ref 8.9–10.3)
Chloride: 115 mmol/L — ABNORMAL HIGH (ref 98–111)
Creatinine, Ser: 3.44 mg/dL — ABNORMAL HIGH (ref 0.44–1.00)
GFR calc Af Amer: 16 mL/min — ABNORMAL LOW (ref >60)
GFR, EST NON AFRICAN AMERICAN: 13 mL/min — AB (ref >60)
Glucose, Bld: 195 mg/dL — ABNORMAL HIGH (ref 70–99)
POTASSIUM: 5.6 mmol/L — AB (ref 3.5–5.1)
Sodium: 144 mmol/L (ref 135–145)

## 2018-06-27 LAB — URINALYSIS, COMPLETE (UACMP) WITH MICROSCOPIC
Bilirubin Urine: NEGATIVE
Glucose, UA: >=500 mg/dL — AB
KETONES UR: NEGATIVE mg/dL
Leukocytes, UA: NEGATIVE
Nitrite: NEGATIVE
Protein, ur: 100 mg/dL — AB
Specific Gravity, Urine: 1.014 (ref 1.005–1.030)
pH: 5 (ref 5.0–8.0)

## 2018-06-27 LAB — GLUCOSE, CAPILLARY
GLUCOSE-CAPILLARY: 269 mg/dL — AB (ref 70–99)
Glucose-Capillary: 166 mg/dL — ABNORMAL HIGH (ref 70–99)
Glucose-Capillary: 214 mg/dL — ABNORMAL HIGH (ref 70–99)
Glucose-Capillary: 263 mg/dL — ABNORMAL HIGH (ref 70–99)
Glucose-Capillary: 338 mg/dL — ABNORMAL HIGH (ref 70–99)

## 2018-06-27 LAB — OSMOLALITY, URINE: Osmolality, Ur: 413 mOsm/kg (ref 300–900)

## 2018-06-27 LAB — POTASSIUM
Potassium: 5.6 mmol/L — ABNORMAL HIGH (ref 3.5–5.1)
Potassium: 5.3 mmol/L — ABNORMAL HIGH (ref 3.5–5.1)

## 2018-06-27 LAB — TROPONIN I
TROPONIN I: 0.04 ng/mL — AB (ref <0.03)
Troponin I: 0.04 ng/mL (ref <0.03)

## 2018-06-27 MED ORDER — METHYLPREDNISOLONE SODIUM SUCC 125 MG IJ SOLR
60.0000 mg | Freq: Two times a day (BID) | INTRAMUSCULAR | Status: DC
  Administered 2018-06-27 – 2018-06-29 (×4): 60 mg via INTRAVENOUS
  Filled 2018-06-27 (×4): qty 2

## 2018-06-27 MED ORDER — DEXTROSE 50 % IV SOLN
1.0000 | Freq: Once | INTRAVENOUS | Status: AC
  Administered 2018-06-27: 50 mL via INTRAVENOUS
  Filled 2018-06-27: qty 50

## 2018-06-27 MED ORDER — PATIROMER SORBITEX CALCIUM 8.4 G PO PACK
8.4000 g | PACK | Freq: Every day | ORAL | Status: AC
  Administered 2018-06-27: 8.4 g via ORAL
  Filled 2018-06-27: qty 1

## 2018-06-27 MED ORDER — EPOETIN ALFA-EPBX 10000 UNIT/ML IJ SOLN
10000.0000 [IU] | INTRAMUSCULAR | Status: DC
  Filled 2018-06-27: qty 1

## 2018-06-27 MED ORDER — PATIROMER SORBITEX CALCIUM 8.4 G PO PACK: 8.4000 g | PACK | Freq: Every day | ORAL | Status: DC

## 2018-06-27 MED ORDER — EPOETIN ALFA 10000 UNIT/ML IJ SOLN
10000.0000 [IU] | INTRAMUSCULAR | Status: DC
  Administered 2018-06-27 – 2018-06-30 (×2): 10000 [IU] via SUBCUTANEOUS
  Filled 2018-06-27 (×4): qty 1

## 2018-06-27 MED ORDER — GUAIFENESIN-DM 100-10 MG/5ML PO SYRP
5.0000 mL | ORAL_SOLUTION | ORAL | Status: DC | PRN
  Administered 2018-06-27: 5 mL via ORAL
  Filled 2018-06-27 (×2): qty 5

## 2018-06-27 MED ORDER — DOXYCYCLINE HYCLATE 100 MG PO TABS
100.0000 mg | ORAL_TABLET | Freq: Two times a day (BID) | ORAL | Status: DC
  Administered 2018-06-27 – 2018-06-30 (×7): 100 mg via ORAL
  Filled 2018-06-27 (×7): qty 1

## 2018-06-27 MED ORDER — IPRATROPIUM-ALBUTEROL 0.5-2.5 (3) MG/3ML IN SOLN
3.0000 mL | Freq: Three times a day (TID) | RESPIRATORY_TRACT | Status: DC
  Administered 2018-06-27 – 2018-07-01 (×12): 3 mL via RESPIRATORY_TRACT
  Filled 2018-06-27 (×12): qty 3

## 2018-06-27 MED ORDER — INSULIN ASPART 100 UNIT/ML IV SOLN
10.0000 [IU] | Freq: Once | INTRAVENOUS | Status: AC
  Administered 2018-06-27: 10 [IU] via INTRAVENOUS
  Filled 2018-06-27: qty 0.1

## 2018-06-27 MED ORDER — AMLODIPINE BESYLATE 10 MG PO TABS
10.0000 mg | ORAL_TABLET | Freq: Every day | ORAL | Status: DC
  Administered 2018-06-28 – 2018-07-01 (×4): 10 mg via ORAL
  Filled 2018-06-27 (×4): qty 1

## 2018-06-27 NOTE — Clinical Social Work Note (Addendum)
CSW received consult that patient is from [facility].  Patient is short term rehab, CSW to facilitate discharge planning.  Jones Broom. [place], MSW, [place]  06/27/2018 9:39 AM

## 2018-06-27 NOTE — Progress Notes (Signed)
PT Cancellation Note  Patient Details Name: Jenny [name] MRN: [id number] DOB: 1956-05-13   Cancelled Treatment:    Reason Eval/Treat Not Completed: Medical issues which prohibited therapy.  Pt's potassium noted to be 5.3 today (and Hgb 7.2).  Per PT guidelines for elevated potassium, will hold PT at this time and re-attempt PT evaluation at a later date/time as medically appropriate.  Leitha Bleak, PT 06/27/18, 2:39 PM [phone]

## 2018-06-27 NOTE — Clinical Social Work Note (Signed)
Clinical Social Work Assessment  Patient Details  Name: Jenny [name] MRN: [id number] Date of Birth: 1956-02-08  Date of referral:  06/27/18               Reason for consult:  Facility Placement                Permission sought to share information with:  Facility Sport and exercise psychologist Permission granted to share information::  Yes, Verbal Permission Granted  Name::     [name]   [phone] or sanford,micah Son   [phone]   Agency::  SNF admissions  Relationship::     Contact Information:     Housing/Transportation Living arrangements for the past 2 months:  [place] of Information:  Patient Patient Interpreter Needed:  None Criminal Activity/Legal Involvement Pertinent to Current Situation/Hospitalization:  No - Comment as needed Significant Relationships:  Adult Children Lives with:  Facility Resident, Self Do you feel safe going back to the place where you live?  Yes Need for family participation in patient care:  No (Coment)  Care giving concerns:  Patient did not express any concerns about returning back to [facility] to continue with her therapy pending insurance authorization.   Social Worker assessment / plan: Patient is a 63 year old female who is alert and oriented x4.  Patient has been at [facility] receiving some short term rehab.  Patient states she does not have any issues with the care that she is receiving.  Patient stated she wants to continue with her therapy so she can get her strength back to return back home.  Patient was informed that insurance approval will have to be started.  Patient plans to return back to SNF, and has given CSW permission to send updated clinicals to SNF.  Patient did not have any other questions or concerns.  Employment status:  Therapist, music:  Managed Care PT Recommendations:  [place] / Referral to community resources:  [place]  Patient/Family's Response to care:  Patient is in agreement to returning back to SNF.  Patient/Family's Understanding of and Emotional Response to Diagnosis, Current Treatment, and Prognosis:  Patient is hopeful that she will not have to be in the hospital for very long so she can continue with her therapy.  Emotional Assessment Appearance:  Appears stated age Attitude/Demeanor/Rapport:    Affect (typically observed):    Orientation:  Oriented to  Time, Oriented to Place, Oriented to Self, Oriented to Situation Alcohol / Substance use:  Not Applicable Psych involvement (Current and /or in the community):  No (Comment)  Discharge Needs  Concerns to be addressed:  Lack of Support, Care Coordination Readmission within the last 30 days:  No Current discharge risk:  Lack of support system Barriers to Discharge:  No Barriers Identified, Continued Medical Work up   Anell Barr 06/27/2018, 5:47 PM

## 2018-06-27 NOTE — Plan of Care (Signed)
Nutrition Education Note  RD consulted for nutrition education regarding CHF.  Spoke with pt at bedside. Pt reports that she has a good appetite and eats 3 full meals daily. Pt expresses that she would like to transition to eating smaller, more frequent meals rather than 3 large meals. Pt shares that her sons cook for her and that they do not use salt when cooking.  Breakfast: scrambled eggs, grits, fruit, yogurt, juice Lunch: pasta or fish Dinner: chicken or fish or steak  Pt states that the only seasonings that her sons use are black pepper, garlic powder, and paprika. Pt states that she does not salt her food at the table.  Pt shares that she used to eat a lot of "processed foods" including canned soup and frozen meals but no longer does this.  RD provided "Low Sodium Nutrition Therapy" handout from the Academy of Nutrition and Dietetics. Reviewed patient's dietary recall. Provided examples on ways to decrease sodium intake in diet. Discouraged intake of processed foods and use of salt shaker. Encouraged fresh fruits and vegetables as well as whole grain sources of carbohydrates to maximize fiber intake.   RD discussed why it is important for patient to adhere to diet recommendations, and emphasized the role of fluids, foods to avoid, and importance of weighing self daily. Teach back method used.  Expect good compliance.  Body mass index is 26.68 kg/m. Pt meets criteria for overweight based on current BMI.  Current diet order is Heart Healthy, patient is consuming approximately 100% of meals at this time. Labs and medications reviewed. No further nutrition interventions warranted at this time. RD contact information provided. If additional nutrition issues arise, please re-consult RD.    Gaynell Face, MS, RD, LDN Inpatient Clinical Dietitian Pager: [phone] Weekend/After Hours: [phone]

## 2018-06-27 NOTE — Plan of Care (Signed)
  Problem: Safety: Goal: Ability to remain free from injury will improve Outcome: Progressing   

## 2018-06-27 NOTE — Progress Notes (Addendum)
[facility] at [place] NAME: Jenny [name]    MR#:  [id number]  DATE OF BIRTH:  1955-12-02  SUBJECTIVE:   Patient states she is feeling fine this morning.  She states that her shortness of breath is at baseline.  She denies any chest pain.  She does not want to have a thoracentesis ordered.  REVIEW OF SYSTEMS:  Review of Systems  Constitutional: Negative for chills and fever.  HENT: Negative for congestion and sore throat.   Eyes: Negative for blurred vision and double vision.  Respiratory: Negative for cough and shortness of breath.   Cardiovascular: Negative for chest pain and leg swelling.  Gastrointestinal: Negative for nausea and vomiting.  Genitourinary: Negative for dysuria and urgency.  Musculoskeletal: Negative for back pain and neck pain.  Neurological: Negative for dizziness and headaches.  Psychiatric/Behavioral: Negative for depression. The patient is not nervous/anxious.    DRUG ALLERGIES:   Allergies  Allergen Reactions  . Penicillins Rash   VITALS:  Blood pressure (!) 155/66, pulse 67, temperature 97.7 F (36.5 C), temperature source Oral, resp. rate 16, height 5\' 5"  (1.651 m), weight 72.7 kg, SpO2 94 %. PHYSICAL EXAMINATION:  Physical Exam  GENERAL:  63 y.o.-year-old patient lying in the bed with no acute distress.  Frail-appearing. EYES: Pupils equal, round, reactive to light and accommodation. No scleral icterus. Extraocular muscles intact.  HEENT: Head atraumatic, normocephalic. Oropharynx and nasopharynx clear.  NECK:  Supple, no jugular venous distention. No thyroid enlargement, no tenderness.  LUNGS:  Diminished breath sounds in the left lung base.  No wheezing or crackles. No use of accessory muscles of respiration.  CARDIOVASCULAR: RRR, S1, S2 normal. No murmurs, rubs, or gallops.  ABDOMEN: Soft, nontender, nondistended. Bowel sounds present. No organomegaly or mass.  EXTREMITIES: No pedal edema, cyanosis, or  clubbing.  NEUROLOGIC: Cranial nerves II through XII are intact. +global weakness. Sensation intact. Gait not checked.  PSYCHIATRIC: The patient is alert and oriented x 3.  SKIN: No obvious rash, lesion, or ulcer.  LABORATORY PANEL:  Female CBC Recent Labs  Lab 06/26/18 1808  WBC 10.1  HGB 7.2*  HCT 25.4*  PLT 358   ------------------------------------------------------------------------------------------------------------------ Chemistries  Recent Labs  Lab 06/26/18 1808 06/27/18 0401  06/27/18 1010  NA 142 144  --   --   K 6.0* 5.6*   < > 5.3*  CL 115* 115*  --   --   CO2 20* 20*  --   --   GLUCOSE 196* 195*  --   --   BUN 69* 71*  --   --   CREATININE 3.55* 3.44*  --   --   CALCIUM 7.1* 7.4*  --   --   AST 19  --   --   --   ALT 28  --   --   --   ALKPHOS 105  --   --   --   BILITOT 0.5  --   --   --    < > = values in this interval not displayed.   RADIOLOGY:  Dg Chest 1 View  Result Date: 06/26/2018 CLINICAL DATA:  Increase shortness-of-breath 1 day. EXAM: CHEST  1 VIEW COMPARISON:  05/29/2018 and 04/05/2018 FINDINGS: Lungs are somewhat hypoinflated with worsening opacification over the right base and left mid to lower lung likely worsening moderate size left effusion and small right effusion with associated bibasilar atelectasis. Infection in the lung bases is possible. There is  hazy prominence of the perihilar markings suggesting mild interstitial edema. Remainder of the exam is unchanged. IMPRESSION: Worsening opacification over the left mid to lower lung and right base suggesting moderate left effusion and small right effusion with associated bibasilar atelectasis. Infection in the lung bases is possible. Suggestion mild interstitial edema. Electronically Signed   By: Marin Olp M.D.   On: 06/26/2018 18:46   US Renal  Result Date: 06/27/2018 CLINICAL DATA:  Acute kidney failure. EXAM: RENAL / URINARY TRACT ULTRASOUND COMPLETE COMPARISON:  None. FINDINGS: Right  Kidney: Renal measurements: 9.2 x 4.6 x 5.9 cm = volume: 131 mL. Diffuse increased echotexture of the kidney is noted. No mass or hydronephrosis visualized. Left Kidney: Renal measurements: 10.5 x 5.1 x 5.1 cm = volume: 144 mL. Diffuse increased echotexture of the kidney is noted. No mass or hydronephrosis visualized. Bladder: Appears normal for degree of bladder distention. Bilateral ureteral jets are noted. Incidental finding of bilateral pleural effusions and small ascites. IMPRESSION: Diffuse increased echotexture bilateral kidneys is can be seen in medical renal disease. No hydronephrosis is noted bilaterally. Bilateral pleural effusions and small ascites. Electronically Signed   By: Abelardo Diesel M.D.   On: 06/27/2018 10:59   ASSESSMENT AND PLAN:   Acute on chronic hypoxic respiratory failure- due to acute COPD exacerbation, left-sided pleural effusion, and acute on chronic combined systolic and diastolic congestive heart failure. Patient states her breathing is at baseline.  She is on her home 2 L O2 this morning. -Continue supplemental oxygen  Acute exacerbation of COPD-improved -Continue IV solumedrol -Switch doxycycline to p.o. -Sputum culture pending -Continue home inhalers -Duonebs every 6 hours  Acute left moderate pleural effusion- seen on chest x-ray -Patient declines thoracentesis -Repeat chest x-ray in the morning  Acute on chronic combined systolic and diastolic congestive heart failure exacerbation- improved.  Recent ECHO with EF 40-45% and grade 2 diastolic dysfunction. -Continue IV Lasix twice daily -Strict I/O, daily weights  AKI with chronic kidney disease stage IV- creatinine improved from 3.55>3.44 (baseline ~3.0) -Nephrology consult -Check UA and renal ultrasound -Avoid nephrotoxic agents  Hyperkalemia- due to above. K improving. -Will give another dose of Veltassa today  Chronic macrocytic anemia- patient is a Jehovah's Witness. Hemoglobin improved from  baseline.  Recent B12 and folate were normal. -Monitor  Essential hypertension- BPs mildly elevated -Continue home Coreg -Increase Norvasc to 10 mg daily -Hydralazine IV as needed  All the records are reviewed and case discussed with Care Management/Social Worker. Management plans discussed with the patient, family and they are in agreement.  CODE STATUS: Full Code  TOTAL TIME TAKING CARE OF THIS PATIENT: 40 minutes.   More than 50% of the time was spent in counseling/coordination of care: YES  POSSIBLE D/C IN 1-2 DAYS, DEPENDING ON CLINICAL CONDITION.   Berna Spare Efrat Zuidema M.D on 06/27/2018 at 11:33 AM  Between 7am to 6pm - Pager - [phone]  After 6pm go to [email or web address] - Proofreader  [facility]  Office  [phone]  CC: Primary care physician; Juluis Pitch, MD  Note: This dictation was prepared with Dragon dictation along with smaller phrase technology. Any transcriptional errors that result from this process are unintentional.

## 2018-06-27 NOTE — NC FL2 (Signed)
[place] LEVEL OF CARE SCREENING TOOL     IDENTIFICATION  Patient Name: Jenny [name] Birthdate: [birth year] Sex: female Admission Date (Current Location): 06/26/2018  [place] and [place] Number:  Engineering geologist and Address:  [facility], [address] [place], [place] [address]      Provider Number: [id number]  Attending Physician Name and Address:  Sela Hua, MD  Relative Name and Phone Number:  [name]   [phone] or sanford,micah Son   [phone]     Current Level of Care: Hospital Recommended Level of Care: [place] Prior Approval Number:    Date Approved/Denied:   PASRR Number: [id number]  Discharge Plan: SNF    Current Diagnoses: Patient Active Problem List   Diagnosis Date Noted  . Respiratory failure ([place]) 06/26/2018  . Goals of care, counseling/discussion   . Palliative care by specialist   . DNR (do not resuscitate) discussion   . CHF (congestive heart failure) ([place]) 05/29/2018    Orientation RESPIRATION BLADDER Height & Weight     Self, Time, Situation, Place  O2(2L) Incontinent Weight: 160 lb 4.8 oz (72.7 kg) Height:  5\' 5"  (165.1 cm)  BEHAVIORAL SYMPTOMS/MOOD NEUROLOGICAL BOWEL NUTRITION STATUS      Continent Diet(Regular diet)  AMBULATORY STATUS COMMUNICATION OF NEEDS Skin   Limited Assist Verbally Normal                       Personal Care Assistance Level of Assistance  Feeding, Dressing, Bathing Bathing Assistance: Limited assistance Feeding assistance: Independent Dressing Assistance: Limited assistance     Functional Limitations Info  Hearing, Speech, Sight Sight Info: Adequate Hearing Info: Adequate Speech Info: Adequate    SPECIAL CARE FACTORS FREQUENCY  PT (By licensed PT), OT (By licensed OT)     PT Frequency: 5x a week OT Frequency: 5x a week            Contractures Contractures Info: Not present    Additional Factors Info   Code Status, Allergies, Insulin Sliding Scale Code Status Info: Full Code Allergies Info: PENICILLINS   Insulin Sliding Scale Info: insulin aspart (novoLOG) injection 0-15 Units 3x a day with meals.       Current Medications (06/27/2018):  This is the current hospital active medication list Current Facility-Administered Medications  Medication Dose Route Frequency Provider Last Rate Last Dose  . acetaminophen (TYLENOL) tablet 650 mg  650 mg Oral Q6H PRN Salary, Montell D, MD       Or  . acetaminophen (TYLENOL) suppository 650 mg  650 mg Rectal Q6H PRN Salary, Montell D, MD      . Derrill Memo ON 06/28/2018] amLODipine (NORVASC) tablet 10 mg  10 mg Oral Daily Mayo, Pete Pelt, MD      . atorvastatin (LIPITOR) tablet 80 mg  80 mg Oral Daily Salary, Montell D, MD   80 mg at 06/27/18 0957  . carvedilol (COREG) tablet 25 mg  25 mg Oral BID Loney Hering D, MD   25 mg at 06/27/18 0957  . cholecalciferol (VITAMIN D) tablet 5,000 Units  5,000 Units Oral Daily Salary, Avel Peace, MD   5,000 Units at 06/27/18 0959  . doxycycline (VIBRA-TABS) tablet 100 mg  100 mg Oral Q12H Mayo, Pete Pelt, MD   100 mg at 06/27/18 1243  . epoetin alfa (EPOGEN,PROCRIT) injection 10,000 Units  10,000 Units Subcutaneous Q M,W,F Lance Coon, MD   10,000 Units at 06/27/18 1159  . furosemide (  LASIX) injection 40 mg  40 mg Intravenous BID Loney Hering D, MD   40 mg at 06/27/18 1729  . guaiFENesin-dextromethorphan (ROBITUSSIN DM) 100-10 MG/5ML syrup 5 mL  5 mL Oral Q4H PRN Mayo, Pete Pelt, MD      . heparin injection 5,000 Units  5,000 Units Subcutaneous Q8H Salary, Holly Bodily D, MD   5,000 Units at 06/27/18 0451  . hydrALAZINE (APRESOLINE) injection 10 mg  10 mg Intravenous Q4H PRN Salary, Montell D, MD      . HYDROcodone-acetaminophen (NORCO/VICODIN) 5-325 MG per tablet 1-2 tablet  1-2 tablet Oral Q4H PRN Salary, Montell D, MD      . insulin aspart (novoLOG) injection 0-15 Units  0-15 Units Subcutaneous TID WC Salary, Montell D,  MD   8 Units at 06/27/18 1729  . insulin aspart (novoLOG) injection 0-5 Units  0-5 Units Subcutaneous QHS Salary, Montell D, MD      . ipratropium-albuterol (DUONEB) 0.5-2.5 (3) MG/3ML nebulizer solution 3 mL  3 mL Nebulization TID Mayo, Pete Pelt, MD      . isosorbide mononitrate (IMDUR) 24 hr tablet 60 mg  60 mg Oral Daily Salary, Montell D, MD   60 mg at 06/27/18 0957  . loperamide (IMODIUM) capsule 2 mg  2 mg Oral Q6H PRN Salary, Montell D, MD   2 mg at 06/27/18 1136  . methylPREDNISolone sodium succinate (SOLU-MEDROL) 125 mg/2 mL injection 60 mg  60 mg Intravenous Q12H Mayo, Pete Pelt, MD      . ondansetron [facility]) tablet 4 mg  4 mg Oral Q6H PRN Salary, Montell D, MD       Or  . ondansetron (ZOFRAN) injection 4 mg  4 mg Intravenous Q6H PRN Salary, Avel Peace, MD      . patiromer Daryll Drown) packet 8.4 g  8.4 g Oral Once Orbie Pyo, MD   Stopped at 06/26/18 1931  . polyethylene glycol (MIRALAX / GLYCOLAX) packet 17 g  17 g Oral Daily PRN Salary, Avel Peace, MD         Discharge Medications: Please see discharge summary for a list of discharge medications.  Relevant Imaging Results:  Relevant Lab Results:   Additional Information SSN [id number]  Ross Ludwig, [place]

## 2018-06-27 NOTE — Progress Notes (Addendum)
Pt son states that she take take epoetin alfa-epbx (RETACRIT) 48307 unit/ml injection. Prime notified. Will continue to monitor.  Update 0130: Dr. Jannifer Franklin place order for RETACRIT 10,000 units. Will continue to monitor.  Update 0543: Pt Potassium is at 5.6. Notify Dr. Marcille Blanco and states will place order. Will continue to monitor.

## 2018-06-27 NOTE — Consult Note (Signed)
[facility]  CONSULT NOTE    Date: 06/27/2018                  Patient Name:  Jenny [name]  MRN: [id number]  DOB: 12/22/1955  Age / Sex: 63 y.o., female         PCP: Juluis Pitch, MD                 Service Requesting Consult: Dr. Stark Jock                 Reason for Consult: Acute renal failure            History of Present Illness: Jenny [name] presents on 06/26/2018 for Hyperkalemia [E87.5] Hypoxia [R09.02] Acute renal failure superimposed on chronic kidney disease, unspecified CKD stage, unspecified acute renal failure type ([place]) [N17.9, N18.9] Acute on chronic congestive heart failure, unspecified heart failure type ([place]) [I50.9]  Patient has had a negative work up. Nephrology consulted for worsening renal failure with hyperkalemia.   Medications: Outpatient medications: Medications Prior to Admission  Medication Sig Dispense Refill Last Dose  . amLODipine (NORVASC) 2.5 MG tablet Take 5 mg by mouth daily.   06/26/2018 at 0800  . atorvastatin (LIPITOR) 80 MG tablet Take 1 tablet (80 mg total) by mouth daily. 30 tablet 11 06/25/2018 at 1800  . bumetanide (BUMEX) 1 MG tablet Take 1 tablet (1 mg total) by mouth daily. (Patient taking differently: Take 1 mg by mouth 2 (two) times daily. ) 30 tablet 11 06/26/2018 at 0800  . carvedilol (COREG) 25 MG tablet Take 1 tablet by mouth 2 (two) times daily.   06/26/2018 at 0800  . cholecalciferol (VITAMIN D) 25 MCG (1000 UT) tablet Take 5,000 Units by mouth daily.   06/26/2018 at 0900  . dextromethorphan-guaiFENesin (MUCINEX DM) 30-600 MG 12hr tablet Take 1 tablet by mouth 2 (two) times daily as needed for cough.   Unknown at PRN  . epoetin alfa-epbx (RETACRIT) 65784 UNIT/ML injection 15,000 Units every Friday.   06/20/2018 at 1400  . isosorbide mononitrate (IMDUR) 60 MG 24 hr tablet Take 1 tablet (60 mg total) by mouth daily.   06/26/2018 at 0900  . loperamide (IMODIUM) 2 MG capsule Take 2 mg by mouth every 6 (six)  hours as needed for diarrhea or loose stools.   Unknown at PRN    Current medications: Current Facility-Administered Medications  Medication Dose Route Frequency Provider Last Rate Last Dose  . acetaminophen (TYLENOL) tablet 650 mg  650 mg Oral Q6H PRN Salary, Montell D, MD       Or  . acetaminophen (TYLENOL) suppository 650 mg  650 mg Rectal Q6H PRN Salary, Montell D, MD      . Derrill Memo ON 06/28/2018] amLODipine (NORVASC) tablet 10 mg  10 mg Oral Daily Mayo, Pete Pelt, MD      . atorvastatin (LIPITOR) tablet 80 mg  80 mg Oral Daily Salary, Montell D, MD   80 mg at 06/27/18 0957  . carvedilol (COREG) tablet 25 mg  25 mg Oral BID Loney Hering D, MD   25 mg at 06/27/18 0957  . cholecalciferol (VITAMIN D) tablet 5,000 Units  5,000 Units Oral Daily Salary, Avel Peace, MD   5,000 Units at 06/27/18 0959  . doxycycline (VIBRA-TABS) tablet 100 mg  100 mg Oral Q12H Mayo, Pete Pelt, MD   100 mg at 06/27/18 1243  . epoetin alfa (EPOGEN,PROCRIT) injection 10,000 Units  10,000 Units Subcutaneous Q M,W,F Lance Coon,  MD   10,000 Units at 06/27/18 1159  . furosemide (LASIX) injection 40 mg  40 mg Intravenous BID Loney Hering D, MD   40 mg at 06/27/18 0802  . heparin injection 5,000 Units  5,000 Units Subcutaneous Q8H Salary, Montell D, MD   5,000 Units at 06/27/18 0451  . hydrALAZINE (APRESOLINE) injection 10 mg  10 mg Intravenous Q4H PRN Salary, Montell D, MD      . HYDROcodone-acetaminophen (NORCO/VICODIN) 5-325 MG per tablet 1-2 tablet  1-2 tablet Oral Q4H PRN Salary, Montell D, MD      . insulin aspart (novoLOG) injection 0-15 Units  0-15 Units Subcutaneous TID WC Salary, Montell D, MD   8 Units at 06/27/18 1243  . insulin aspart (novoLOG) injection 0-5 Units  0-5 Units Subcutaneous QHS Salary, Montell D, MD      . ipratropium-albuterol (DUONEB) 0.5-2.5 (3) MG/3ML nebulizer solution 3 mL  3 mL Nebulization TID Mayo, Pete Pelt, MD      . isosorbide mononitrate (IMDUR) 24 hr tablet 60 mg  60 mg Oral  Daily Salary, Montell D, MD   60 mg at 06/27/18 0957  . loperamide (IMODIUM) capsule 2 mg  2 mg Oral Q6H PRN Salary, Montell D, MD   2 mg at 06/27/18 1136  . methylPREDNISolone sodium succinate (SOLU-MEDROL) 125 mg/2 mL injection 60 mg  60 mg Intravenous Q6H Salary, Montell D, MD   60 mg at 06/27/18 1000  . ondansetron (ZOFRAN) tablet 4 mg  4 mg Oral Q6H PRN Salary, Montell D, MD       Or  . ondansetron (ZOFRAN) injection 4 mg  4 mg Intravenous Q6H PRN Salary, Avel Peace, MD      . patiromer Daryll Drown) packet 8.4 g  8.4 g Oral Once Orbie Pyo, MD   Stopped at 06/26/18 1931  . polyethylene glycol (MIRALAX / GLYCOLAX) packet 17 g  17 g Oral Daily PRN Salary, Avel Peace, MD          Allergies: Allergies  Allergen Reactions  . Penicillins Rash      Past Medical History: Past Medical History:  Diagnosis Date  . Anemia   . CHF (congestive heart failure) ([place])   . CKD (chronic kidney disease) stage 4, GFR 15-29 ml/min (HCC)   . Diabetes mellitus without complication ([place])   . Hypertension   . Patient is Jehovah's Witness      Past Surgical History: History reviewed. No pertinent surgical history.   Family History: No family history on file.   Social History: Social History   Socioeconomic History  . Marital status: Divorced    Spouse name: Not on file  . Number of children: Not on file  . Years of education: Not on file  . Highest education level: Not on file  Occupational History  . Not on file  Social Needs  . Financial resource strain: Not on file  . Food insecurity:    Worry: Not on file    Inability: Not on file  . Transportation needs:    Medical: Not on file    Non-medical: Not on file  Tobacco Use  . Smoking status: Former Research scientist (life sciences)  . Smokeless tobacco: Never Used  Substance and Sexual Activity  . Alcohol use: Not Currently  . Drug use: Never  . Sexual activity: Not on file  Lifestyle  . Physical activity:    Days per week: Not on file     Minutes per session: Not on file  . Stress:  Not on file  Relationships  . Social connections:    Talks on phone: Not on file    Gets together: Not on file    Attends religious service: Not on file    Active member of club or organization: Not on file    Attends meetings of clubs or organizations: Not on file    Relationship status: Not on file  . Intimate partner violence:    Fear of current or ex partner: Not on file    Emotionally abused: Not on file    Physically abused: Not on file    Forced sexual activity: Not on file  Other Topics Concern  . Not on file  Social History Narrative  . Not on file     Review of Systems: ROS  Vital Signs: Blood pressure (!) 155/66, pulse 67, temperature 97.7 F (36.5 C), temperature source Oral, resp. rate 16, height 5\' 5"  (1.651 m), weight 72.7 kg, SpO2 94 %.  Weight trends: Filed Weights   06/26/18 1807 06/26/18 2212 06/27/18 0407  Weight: 59 kg 72.3 kg 72.7 kg    Physical Exam: General: NAD,   Head: Normocephalic, atraumatic. Moist oral mucosal membranes  Eyes: Anicteric, PERRL  Neck: Supple, trachea midline  Lungs:  Clear to auscultation  Heart: Regular rate and rhythm  Abdomen:  Soft, nontender,   Extremities:  no peripheral edema.  Neurologic: Nonfocal, moving all four extremities  Skin: No lesions         Lab results: Basic Metabolic Panel: Recent Labs  Lab 06/26/18 1808 06/27/18 0401 06/27/18 0700 06/27/18 1010  NA 142 144  --   --   K 6.0* 5.6* 5.6* 5.3*  CL 115* 115*  --   --   CO2 20* 20*  --   --   GLUCOSE 196* 195*  --   --   BUN 69* 71*  --   --   CREATININE 3.55* 3.44*  --   --   CALCIUM 7.1* 7.4*  --   --     Liver Function Tests: Recent Labs  Lab 06/26/18 1808  AST 19  ALT 28  ALKPHOS 105  BILITOT 0.5  PROT 5.3*  ALBUMIN 2.1*   No results for input(s): LIPASE, AMYLASE in the last 168 hours. No results for input(s): AMMONIA in the last 168 hours.  CBC: Recent Labs  Lab  06/26/18 1808  WBC 10.1  NEUTROABS 6.7  HGB 7.2*  HCT 25.4*  MCV 101.6*  PLT 358    Cardiac Enzymes: Recent Labs  Lab 06/26/18 1808 06/26/18 2222 06/27/18 0401 06/27/18 1010  TROPONINI 0.04* 0.04* 0.04* 0.04*    BNP: Invalid input(s): POCBNP  CBG: Recent Labs  Lab 06/26/18 2205 06/27/18 0607 06/27/18 0744 06/27/18 1226  GLUCAP 107* 166* 214* 263*    Microbiology: Results for orders placed or performed during the hospital encounter of 05/29/18  MRSA PCR Screening     Status: None   Collection Time: 05/30/18  4:39 AM  Result Value Ref Range Status   MRSA by PCR NEGATIVE NEGATIVE Final    Comment:        The GeneXpert MRSA Assay (FDA approved for NASAL specimens only), is one component of a comprehensive MRSA colonization surveillance program. It is not intended to diagnose MRSA infection nor to guide or monitor treatment for MRSA infections. Performed at [facility], [address] [place], [place] [address]     Coagulation Studies: No results for input(s): LABPROT, INR in the  last 72 hours.  Urinalysis: No results for input(s): COLORURINE, LABSPEC, PHURINE, GLUCOSEU, HGBUR, BILIRUBINUR, KETONESUR, PROTEINUR, UROBILINOGEN, NITRITE, LEUKOCYTESUR in the last 72 hours.  Invalid input(s): APPERANCEUR    Imaging: Dg Chest 1 View  Result Date: 06/26/2018 CLINICAL DATA:  Increase shortness-of-breath 1 day. EXAM: CHEST  1 VIEW COMPARISON:  05/29/2018 and 04/05/2018 FINDINGS: Lungs are somewhat hypoinflated with worsening opacification over the right base and left mid to lower lung likely worsening moderate size left effusion and small right effusion with associated bibasilar atelectasis. Infection in the lung bases is possible. There is hazy prominence of the perihilar markings suggesting mild interstitial edema. Remainder of the exam is unchanged. IMPRESSION: Worsening opacification over the left mid to lower lung and right base suggesting moderate  left effusion and small right effusion with associated bibasilar atelectasis. Infection in the lung bases is possible. Suggestion mild interstitial edema. Electronically Signed   By: Marin Olp M.D.   On: 06/26/2018 18:46   US Renal  Result Date: 06/27/2018 CLINICAL DATA:  Acute kidney failure. EXAM: RENAL / URINARY TRACT ULTRASOUND COMPLETE COMPARISON:  None. FINDINGS: Right Kidney: Renal measurements: 9.2 x 4.6 x 5.9 cm = volume: 131 mL. Diffuse increased echotexture of the kidney is noted. No mass or hydronephrosis visualized. Left Kidney: Renal measurements: 10.5 x 5.1 x 5.1 cm = volume: 144 mL. Diffuse increased echotexture of the kidney is noted. No mass or hydronephrosis visualized. Bladder: Appears normal for degree of bladder distention. Bilateral ureteral jets are noted. Incidental finding of bilateral pleural effusions and small ascites. IMPRESSION: Diffuse increased echotexture bilateral kidneys is can be seen in medical renal disease. No hydronephrosis is noted bilaterally. Bilateral pleural effusions and small ascites. Electronically Signed   By: Abelardo Diesel M.D.   On: 06/27/2018 10:59      Assessment & Plan: Jenny [name] is a 63 y.o. black female Jehovah witness with hypertension, diabetes mellitus type II, congestive heart failure, anemia , who was admitted to [facility] on 06/26/2018 for Hyperkalemia [E87.5] Hypoxia [R09.02] Acute renal failure superimposed on chronic kidney disease, unspecified CKD stage, unspecified acute renal failure type ([place]) [N17.9, N18.9] Acute on chronic congestive heart failure, unspecified heart failure type ([place]) [I50.9]  1. Acute renal failure with hyperkalemia on chronic kidney disease stage IV: with proteinuria.  Baseline creatinine of 2.64, GFR of 21. Follows with [facility].  Chronic kidney disease secondary to diabetes and hypertension.  Acute renal failure secondary to acute cardiorenal syndrome and hypertension urgency.  - holding  benazepirl - Veltassa - Checking urinalysis  2. Hypertension: with chest pain Holding benazepril - started on isosorbide mononitrate on this admission - amlodipine, carvedilol, isosorbide mononitrate  - IV furosemide.   3. Diabetes mellitus type II with chronic kidney disease: glucose controlled. Hemoglobin A1c of 5.5%.   LOS: 1 Helmi Hechavarria 1/24/20202:33 PM

## 2018-06-28 ENCOUNTER — Inpatient Hospital Stay: Payer: BC Managed Care – PPO

## 2018-06-28 LAB — BASIC METABOLIC PANEL
Anion gap: 4 — ABNORMAL LOW (ref 5–15)
BUN: 73 mg/dL — ABNORMAL HIGH (ref 8–23)
CO2: 20 mmol/L — ABNORMAL LOW (ref 22–32)
Calcium: 7.3 mg/dL — ABNORMAL LOW (ref 8.9–10.3)
Chloride: 116 mmol/L — ABNORMAL HIGH (ref 98–111)
Creatinine, Ser: 3.35 mg/dL — ABNORMAL HIGH (ref 0.44–1.00)
GFR calc non Af Amer: 14 mL/min — ABNORMAL LOW (ref >60)
GFR, EST AFRICAN AMERICAN: 16 mL/min — AB (ref >60)
Glucose, Bld: 324 mg/dL — ABNORMAL HIGH (ref 70–99)
Potassium: 5.2 mmol/L — ABNORMAL HIGH (ref 3.5–5.1)
Sodium: 140 mmol/L (ref 135–145)

## 2018-06-28 LAB — CBC
HCT: 25.1 % — ABNORMAL LOW (ref 36.0–46.0)
Hemoglobin: 7.2 g/dL — ABNORMAL LOW (ref 12.0–15.0)
MCH: 29 pg (ref 26.0–34.0)
MCHC: 28.7 g/dL — ABNORMAL LOW (ref 30.0–36.0)
MCV: 101.2 fL — ABNORMAL HIGH (ref 80.0–100.0)
NRBC: 0.2 % (ref 0.0–0.2)
Platelets: 351 10*3/uL (ref 150–400)
RBC: 2.48 MIL/uL — ABNORMAL LOW (ref 3.87–5.11)
RDW: 21.6 % — ABNORMAL HIGH (ref 11.5–15.5)
WBC: 9.9 10*3/uL (ref 4.0–10.5)

## 2018-06-28 LAB — GLUCOSE, CAPILLARY
GLUCOSE-CAPILLARY: 272 mg/dL — AB (ref 70–99)
GLUCOSE-CAPILLARY: 340 mg/dL — AB (ref 70–99)
Glucose-Capillary: 285 mg/dL — ABNORMAL HIGH (ref 70–99)
Glucose-Capillary: 390 mg/dL — ABNORMAL HIGH (ref 70–99)

## 2018-06-28 MED ORDER — SODIUM CHLORIDE 0.9% FLUSH
3.0000 mL | Freq: Two times a day (BID) | INTRAVENOUS | Status: DC
  Administered 2018-06-28 – 2018-07-01 (×6): 3 mL via INTRAVENOUS

## 2018-06-28 MED ORDER — PATIROMER SORBITEX CALCIUM 8.4 G PO PACK
8.4000 g | PACK | Freq: Every day | ORAL | Status: DC
  Administered 2018-06-28: 8.4 g via ORAL
  Filled 2018-06-28 (×2): qty 1

## 2018-06-28 NOTE — Progress Notes (Signed)
PT Cancellation Note  Patient Details Name: Jenny [name] MRN: [id number] DOB: [birth year]   Cancelled Treatment:    Reason Eval/Treat Not Completed: Medical issues which prohibited therapy. Unchanged labs from yesterday.  Will ck later to see how pt is progressing.   Ramond Dial 06/28/2018, 9:27 AM  Mee Hives, PT MS Acute Rehab Dept. Number: [place] and [place]

## 2018-06-28 NOTE — Progress Notes (Signed)
[place] [facility] [place] NAME: Jenny [name]    MR#:  [id number]  DATE OF BIRTH:  Oct 08, 1955  SUBJECTIVE:   States that her shortness of breath has gotten much better since yesterday.  She denies any cough.  She denies any chest pain or lower extremity edema.  REVIEW OF SYSTEMS:  Review of Systems  Constitutional: Negative for chills and fever.  HENT: Negative for congestion and sore throat.   Eyes: Negative for blurred vision and double vision.  Respiratory: Negative for cough and shortness of breath.   Cardiovascular: Negative for chest pain and leg swelling.  Gastrointestinal: Negative for nausea and vomiting.  Genitourinary: Negative for dysuria and urgency.  Musculoskeletal: Negative for back pain and neck pain.  Neurological: Negative for dizziness and headaches.  Psychiatric/Behavioral: Negative for depression. The patient is not nervous/anxious.    DRUG ALLERGIES:   Allergies  Allergen Reactions  . Penicillins Rash   VITALS:  Blood pressure (!) 150/80, pulse 68, temperature (!) 97.4 F (36.3 C), temperature source Oral, resp. rate 20, height 5\' 5"  (1.651 m), weight 74.9 kg, SpO2 96 %. PHYSICAL EXAMINATION:  Physical Exam  GENERAL:  63 y.o.-year-old patient lying in the bed with no acute distress.  Frail-appearing. EYES: Pupils equal, round, reactive to light and accommodation. No scleral icterus. Extraocular muscles intact.  HEENT: Head atraumatic, normocephalic. Oropharynx and nasopharynx clear.  NECK:  Supple, no jugular venous distention. No thyroid enlargement, no tenderness.  LUNGS:  + Bibasilar crackles present. Diminished breath sounds in the left lung base.  No wheezing. No use of accessory muscles of respiration.  Nasal cannula in place. CARDIOVASCULAR: RRR, S1, S2 normal. No murmurs, rubs, or gallops.  ABDOMEN: Soft, nontender, nondistended. Bowel sounds present. No organomegaly or mass.  EXTREMITIES: No pedal edema,  cyanosis, or clubbing.  NEUROLOGIC: Cranial nerves II through XII are intact. +global weakness. Sensation intact. Gait not checked.  PSYCHIATRIC: The patient is alert and oriented x 3.  SKIN: No obvious rash, lesion, or ulcer.  LABORATORY PANEL:  Female CBC Recent Labs  Lab 06/28/18 0427  WBC 9.9  HGB 7.2*  HCT 25.1*  PLT 351   ------------------------------------------------------------------------------------------------------------------ Chemistries  Recent Labs  Lab 06/26/18 1808  06/28/18 0427  NA 142   < > 140  K 6.0*   < > 5.2*  CL 115*   < > 116*  CO2 20*   < > 20*  GLUCOSE 196*   < > 324*  BUN 69*   < > 73*  CREATININE 3.55*   < > 3.35*  CALCIUM 7.1*   < > 7.3*  AST 19  --   --   ALT 28  --   --   ALKPHOS 105  --   --   BILITOT 0.5  --   --    < > = values in this interval not displayed.   RADIOLOGY:  Dg Chest 1 View  Result Date: 06/28/2018 CLINICAL DATA:  Shortness of breath EXAM: CHEST  1 VIEW COMPARISON:  Two days ago FINDINGS: Moderate to large left pleural effusion. Improvement in interstitial opacity. Unchanged cardiomegaly that is partially obscured by pleural disease. IMPRESSION: 1. Probable improvement in pulmonary edema. 2. Continued moderate to large left pleural effusion. Electronically Signed   By: Monte Fantasia M.D.   On: 06/28/2018 07:29   ASSESSMENT AND PLAN:   Acute on chronic hypoxic respiratory failure- due to acute COPD exacerbation, left-sided pleural effusion, and acute  on chronic combined systolic and diastolic congestive heart failure. She is on her home 2 L O2 this morning. -Continue supplemental oxygen  Acute exacerbation of COPD- improved -Continue IV solumedrol -Switch doxycycline to p.o. -Continue home inhalers -Duonebs every 6 hours  Acute left moderate pleural effusion- repeat chest x-ray this morning with moderate to large pleural effusion -Patient continues to decline thoracentesis  Acute on chronic combined systolic  and diastolic congestive heart failure exacerbation- improved.  Recent ECHO with EF 40-45% and grade 2 diastolic dysfunction. -Repeat chest x-ray this morning with improvement in pulmonary edema -Continue IV Lasix twice daily -Strict I/O, daily weights  AKI with chronic kidney disease stage IV- creatinine improved from 3.55 > 3.44 > 3.35 (baseline ~3.0) -Nephrology consult -Renal ultrasound with diffuse increased echotexture of bilateral kidneys, consistent with medical renal disease. -Avoid nephrotoxic agents  Hyperkalemia- due to AKI. K improving from 6.0 on admission to 5.2 this morning. -Will give another dose of Veltassa today  Chronic macrocytic anemia- patient is a Jehovah's Witness. Hemoglobin is stable.  Recent B12 and folate were normal. -Monitor  Essential hypertension- BPs mildly elevated -Continue home Coreg, continue Norvasc at increased dose -Hydralazine IV as needed  All the records are reviewed and case discussed with Care Management/Social Worker. Management plans discussed with the patient, family and they are in agreement.  CODE STATUS: Full Code  TOTAL TIME TAKING CARE OF THIS PATIENT: 40 minutes.   More than 50% of the time was spent in counseling/coordination of care: YES  POSSIBLE D/C tomorrow, DEPENDING ON CLINICAL CONDITION.   Berna Spare  M.D on 06/28/2018 at 1:56 PM  Between 7am to 6pm - Pager - [phone]  After 6pm go to [email or web address] - Proofreader  [facility]  Office  [phone]  CC: Primary care physician; Juluis Pitch, MD  Note: This dictation was prepared with Dragon dictation along with smaller phrase technology. Any transcriptional errors that result from this process are unintentional.

## 2018-06-28 NOTE — Plan of Care (Signed)
  Problem: Health Behavior/Discharge Planning: Goal: Ability to manage health-related needs will improve Outcome: Not Progressing Note:  Patient continues to decline a needed thoracentesis to drain a large pleural effusion. Will continue to monitor. Jenny [name] [facility]

## 2018-06-28 NOTE — Progress Notes (Signed)
[facility]  ROUNDING NOTE   Subjective:   UOP 950  Furosemide 40mg  IV bid  Objective:  Vital signs in last 24 hours:  Temp:  [97.4 F (36.3 C)-98.4 F (36.9 C)] 97.4 F (36.3 C) (01/25 0755) Pulse Rate:  [67-77] 68 (01/25 0755) Resp:  [16-20] 20 (01/25 0755) BP: (141-169)/(63-87) 150/80 (01/25 0755) SpO2:  [93 %-97 %] 96 % (01/25 0829) FiO2 (%):  [28 %] 28 % (01/24 1937) Weight:  [74.9 kg] 74.9 kg (01/25 0349)  Weight change: 16 kg Filed Weights   06/26/18 2212 06/27/18 0407 06/28/18 0349  Weight: 72.3 kg 72.7 kg 74.9 kg    Intake/Output: I/O last 3 completed shifts: In: 840 [P.O.:840] Out: 1150 [Urine:1150]   Intake/Output this shift:  Total I/O In: 240 [P.O.:240] Out: -   Physical Exam: General: NAD,   Head: Normocephalic, atraumatic. Moist oral mucosal membranes  Eyes: Anicteric, PERRL  Neck: Supple, trachea midline  Lungs:  Clear to auscultation  Heart: Regular rate and rhythm  Abdomen:  Soft, nontender,   Extremities:  no peripheral edema.  Neurologic: Nonfocal, moving all four extremities  Skin: No lesions        Basic Metabolic Panel: Recent Labs  Lab 06/26/18 1808 06/27/18 0401 06/27/18 0700 06/27/18 1010 06/28/18 0427  NA 142 144  --   --  140  K 6.0* 5.6* 5.6* 5.3* 5.2*  CL 115* 115*  --   --  116*  CO2 20* 20*  --   --  20*  GLUCOSE 196* 195*  --   --  324*  BUN 69* 71*  --   --  73*  CREATININE 3.55* 3.44*  --   --  3.35*  CALCIUM 7.1* 7.4*  --   --  7.3*    Liver Function Tests: Recent Labs  Lab 06/26/18 1808  AST 19  ALT 28  ALKPHOS 105  BILITOT 0.5  PROT 5.3*  ALBUMIN 2.1*   No results for input(s): LIPASE, AMYLASE in the last 168 hours. No results for input(s): AMMONIA in the last 168 hours.  CBC: Recent Labs  Lab 06/26/18 1808 06/28/18 0427  WBC 10.1 9.9  NEUTROABS 6.7  --   HGB 7.2* 7.2*  HCT 25.4* 25.1*  MCV 101.6* 101.2*  PLT 358 351    Cardiac Enzymes: Recent Labs  Lab 06/26/18 1808  06/26/18 2222 06/27/18 0401 06/27/18 1010  TROPONINI 0.04* 0.04* 0.04* 0.04*    BNP: Invalid input(s): POCBNP  CBG: Recent Labs  Lab 06/27/18 0744 06/27/18 1226 06/27/18 1648 06/27/18 2102 06/28/18 0756  GLUCAP 214* 263* 269* 338* 285*    Microbiology: Results for orders placed or performed during the hospital encounter of 05/29/18  MRSA PCR Screening     Status: None   Collection Time: 05/30/18  4:39 AM  Result Value Ref Range Status   MRSA by PCR NEGATIVE NEGATIVE Final    Comment:        The GeneXpert MRSA Assay (FDA approved for NASAL specimens only), is one component of a comprehensive MRSA colonization surveillance program. It is not intended to diagnose MRSA infection nor to guide or monitor treatment for MRSA infections. Performed at [facility], [address] [place],  [address]     Coagulation Studies: No results for input(s): LABPROT, INR in the last 72 hours.  Urinalysis: Recent Labs    06/27/18 0954  COLORURINE YELLOW*  LABSPEC 1.014  PHURINE 5.0  GLUCOSEU >=500*  HGBUR SMALL*  BILIRUBINUR NEGATIVE  KETONESUR NEGATIVE  PROTEINUR 100*  NITRITE NEGATIVE  LEUKOCYTESUR NEGATIVE      Imaging: Dg Chest 1 View  Result Date: 06/28/2018 CLINICAL DATA:  Shortness of breath EXAM: CHEST  1 VIEW COMPARISON:  Two days ago FINDINGS: Moderate to large left pleural effusion. Improvement in interstitial opacity. Unchanged cardiomegaly that is partially obscured by pleural disease. IMPRESSION: 1. Probable improvement in pulmonary edema. 2. Continued moderate to large left pleural effusion. Electronically Signed   By: Monte Fantasia M.D.   On: 06/28/2018 07:29   Dg Chest 1 View  Result Date: 06/26/2018 CLINICAL DATA:  Increase shortness-of-breath 1 day. EXAM: CHEST  1 VIEW COMPARISON:  05/29/2018 and 04/05/2018 FINDINGS: Lungs are somewhat hypoinflated with worsening opacification over the right base and left mid to lower lung likely  worsening moderate size left effusion and small right effusion with associated bibasilar atelectasis. Infection in the lung bases is possible. There is hazy prominence of the perihilar markings suggesting mild interstitial edema. Remainder of the exam is unchanged. IMPRESSION: Worsening opacification over the left mid to lower lung and right base suggesting moderate left effusion and small right effusion with associated bibasilar atelectasis. Infection in the lung bases is possible. Suggestion mild interstitial edema. Electronically Signed   By: Marin Olp M.D.   On: 06/26/2018 18:46   US Renal  Result Date: 06/27/2018 CLINICAL DATA:  Acute kidney failure. EXAM: RENAL / URINARY TRACT ULTRASOUND COMPLETE COMPARISON:  None. FINDINGS: Right Kidney: Renal measurements: 9.2 x 4.6 x 5.9 cm = volume: 131 mL. Diffuse increased echotexture of the kidney is noted. No mass or hydronephrosis visualized. Left Kidney: Renal measurements: 10.5 x 5.1 x 5.1 cm = volume: 144 mL. Diffuse increased echotexture of the kidney is noted. No mass or hydronephrosis visualized. Bladder: Appears normal for degree of bladder distention. Bilateral ureteral jets are noted. Incidental finding of bilateral pleural effusions and small ascites. IMPRESSION: Diffuse increased echotexture bilateral kidneys is can be seen in medical renal disease. No hydronephrosis is noted bilaterally. Bilateral pleural effusions and small ascites. Electronically Signed   By: Abelardo Diesel M.D.   On: 06/27/2018 10:59     Medications:    . amLODipine  10 mg Oral Daily  . atorvastatin  80 mg Oral Daily  . carvedilol  25 mg Oral BID  . cholecalciferol  5,000 Units Oral Daily  . doxycycline  100 mg Oral Q12H  . epoetin (EPOGEN/PROCRIT) injection  10,000 Units Subcutaneous Q M,W,F  . furosemide  40 mg Intravenous BID  . heparin  5,000 Units Subcutaneous Q8H  . insulin aspart  0-15 Units Subcutaneous TID WC  . insulin aspart  0-5 Units Subcutaneous QHS   . ipratropium-albuterol  3 mL Nebulization TID  . isosorbide mononitrate  60 mg Oral Daily  . methylPREDNISolone (SOLU-MEDROL) injection  60 mg Intravenous Q12H  . patiromer  8.4 g Oral Daily   acetaminophen **OR** acetaminophen, guaiFENesin-dextromethorphan, hydrALAZINE, HYDROcodone-acetaminophen, loperamide, ondansetron **OR** ondansetron (ZOFRAN) IV, polyethylene glycol  Assessment/ Plan:  [name] is a 62 y.o. black female Jehovah witness with hypertension, diabetes mellitus type II, congestive heart failure, anemia , who was admitted to [facility] on 06/26/2018 for acute congestive heart failure  1. Acute renal failure with hyperkalemia on chronic kidney disease stage IV: with proteinuria.  Baseline creatinine of 2.64, GFR of 21. Follows with [facility].  Chronic kidney disease secondary to diabetes and hypertension.  Acute renal failure secondary to acute cardiorenal syndrome and hypertension urgency.  - holding benazepirl -  Veltassa  2. Hypertension: with chest pain. Acute exacerbation of congestive heart failure.  Holding benazepril - started on isosorbide mononitrate on this admission - amlodipine, carvedilol, isosorbide mononitrate  - IV furosemide.   3. Diabetes mellitus type II with chronic kidney disease: glucose controlled. Hemoglobin A1c of 5.5%.    LOS: 2 Kanyon Bunn 1/25/202010:13 AM

## 2018-06-29 LAB — BASIC METABOLIC PANEL
Anion gap: 9 (ref 5–15)
BUN: 78 mg/dL — AB (ref 8–23)
CO2: 19 mmol/L — ABNORMAL LOW (ref 22–32)
Calcium: 7.3 mg/dL — ABNORMAL LOW (ref 8.9–10.3)
Chloride: 110 mmol/L (ref 98–111)
Creatinine, Ser: 3.36 mg/dL — ABNORMAL HIGH (ref 0.44–1.00)
GFR calc Af Amer: 16 mL/min — ABNORMAL LOW (ref >60)
GFR calc non Af Amer: 14 mL/min — ABNORMAL LOW (ref >60)
Glucose, Bld: 368 mg/dL — ABNORMAL HIGH (ref 70–99)
Potassium: 4.8 mmol/L (ref 3.5–5.1)
Sodium: 138 mmol/L (ref 135–145)

## 2018-06-29 LAB — GLUCOSE, CAPILLARY
GLUCOSE-CAPILLARY: 283 mg/dL — AB (ref 70–99)
Glucose-Capillary: 300 mg/dL — ABNORMAL HIGH (ref 70–99)
Glucose-Capillary: 347 mg/dL — ABNORMAL HIGH (ref 70–99)
Glucose-Capillary: 323 mg/dL — ABNORMAL HIGH (ref 70–99)

## 2018-06-29 LAB — MRSA PCR SCREENING: MRSA by PCR: NEGATIVE

## 2018-06-29 MED ORDER — PREDNISONE 50 MG PO TABS
50.0000 mg | ORAL_TABLET | Freq: Every day | ORAL | Status: DC
  Administered 2018-06-30 – 2018-07-01 (×2): 50 mg via ORAL
  Filled 2018-06-29 (×2): qty 1

## 2018-06-29 MED ORDER — BUMETANIDE 1 MG PO TABS
1.0000 mg | ORAL_TABLET | Freq: Two times a day (BID) | ORAL | Status: DC
  Administered 2018-06-29 – 2018-07-01 (×6): 1 mg via ORAL
  Filled 2018-06-29 (×6): qty 1

## 2018-06-29 NOTE — Progress Notes (Signed)
[facility] [place] NAME: Jenny [name]    MR#:  [id number]  DATE OF BIRTH:  1955/10/12  SUBJECTIVE:   States that her shortness of breath is "way, way better than yesterday".  She has not gotten up out of bed.  She denies any chest pain.  REVIEW OF SYSTEMS:  Review of Systems  Constitutional: Negative for chills and fever.  HENT: Negative for congestion and sore throat.   Eyes: Negative for blurred vision and double vision.  Respiratory: Negative for cough and shortness of breath.   Cardiovascular: Negative for chest pain and leg swelling.  Gastrointestinal: Negative for nausea and vomiting.  Genitourinary: Negative for dysuria and urgency.  Musculoskeletal: Negative for back pain and neck pain.  Neurological: Negative for dizziness and headaches.  Psychiatric/Behavioral: Negative for depression. The patient is not nervous/anxious.    DRUG ALLERGIES:   Allergies  Allergen Reactions  . Penicillins Rash   VITALS:  Blood pressure (!) 148/70, pulse 68, temperature 98 F (36.7 C), temperature source Oral, resp. rate 19, height 5\' 5"  (1.651 m), weight 76.1 kg, SpO2 98 %. PHYSICAL EXAMINATION:  Physical Exam  GENERAL:  63 y.o.-year-old patient lying in the bed with no acute distress.  Frail-appearing. EYES: Pupils equal, round, reactive to light and accommodation. No scleral icterus. Extraocular muscles intact.  HEENT: Head atraumatic, normocephalic. Oropharynx and nasopharynx clear.  NECK:  Supple, no jugular venous distention. No thyroid enlargement, no tenderness.  LUNGS:  Diminished breath sounds in the left lung base.  No wheezing or crackles. No use of accessory muscles of respiration.  Nasal cannula in place. CARDIOVASCULAR: RRR, S1, S2 normal. No murmurs, rubs, or gallops.  ABDOMEN: Soft, nontender, nondistended. Bowel sounds present. No organomegaly or mass.  EXTREMITIES: No pedal edema, cyanosis, or clubbing.  NEUROLOGIC:  Cranial nerves II through XII are intact. +global weakness. Sensation intact. Gait not checked.  PSYCHIATRIC: The patient is alert and oriented x 3.  SKIN: No obvious rash, lesion, or ulcer.  LABORATORY PANEL:  Female CBC Recent Labs  Lab 06/28/18 0427  WBC 9.9  HGB 7.2*  HCT 25.1*  PLT 351   ------------------------------------------------------------------------------------------------------------------ Chemistries  Recent Labs  Lab 06/26/18 1808  06/29/18 0321  NA 142   < > 138  K 6.0*   < > 4.8  CL 115*   < > 110  CO2 20*   < > 19*  GLUCOSE 196*   < > 368*  BUN 69*   < > 78*  CREATININE 3.55*   < > 3.36*  CALCIUM 7.1*   < > 7.3*  AST 19  --   --   ALT 28  --   --   ALKPHOS 105  --   --   BILITOT 0.5  --   --    < > = values in this interval not displayed.   RADIOLOGY:  No results found. ASSESSMENT AND PLAN:   Acute on chronic hypoxic respiratory failure- due to acute COPD exacerbation, left-sided pleural effusion, and acute on chronic combined systolic and diastolic congestive heart failure. She is on her home 2 L O2. -Continue supplemental oxygen  Acute exacerbation of COPD- improved -Switch from IV Solu-Medrol to p.o. prednisone -Continue doxycycline for a total 5-day course -Continue home inhalers -Duonebs every 6 hours  Acute left moderate pleural effusion- chest x-ray this morning with moderate to large pleural effusion -Patient continues to decline thoracentesis  Acute on chronic combined systolic and  diastolic congestive heart failure exacerbation- improved.  Recent ECHO with EF 40-45% and grade 2 diastolic dysfunction. -Switch from IV Lasix to p.o. Bumex today -Strict I/O, daily weights  AKI with chronic kidney disease stage IV- creatinine improving -Nephrology following -Renal ultrasound with diffuse increased echotexture of bilateral kidneys, consistent with medical renal disease. -Avoid nephrotoxic agents  Hyperkalemia- due to AKI.  Resolved  with Veltassa. -Continue to monitor  Chronic macrocytic anemia- patient is a Jehovah's Witness. Hemoglobin is stable.  Recent B12 and folate were normal. -Monitor  Essential hypertension- BPs mildly elevated -Continue home Coreg, continue Norvasc at increased dose -Hydralazine IV as needed  Plan for discharge back to SNF.  Awaiting PT eval in order to do insurance authorization.  All the records are reviewed and case discussed with Care Management/Social Worker. Management plans discussed with the patient, family and they are in agreement.  CODE STATUS: Full Code  TOTAL TIME TAKING CARE OF THIS PATIENT: 40 minutes.   More than 50% of the time was spent in counseling/coordination of care: YES  POSSIBLE D/C tomorrow, DEPENDING ON CLINICAL CONDITION.   Berna Spare  M.D on 06/29/2018 at 1:53 PM  Between 7am to 6pm - Pager - [phone]  After 6pm go to [email or web address] - Proofreader  [facility]  Office  [phone]  CC: Primary care physician; Juluis Pitch, MD  Note: This dictation was prepared with Dragon dictation along with smaller phrase technology. Any transcriptional errors that result from this process are unintentional.

## 2018-06-29 NOTE — Progress Notes (Signed)
[facility]  ROUNDING NOTE   Subjective:   UOP 650 - however unclear how accurate.   Patient states she still feels like she has fluid on her.   IV furosemide transitioning to PO bumetanide today.   Objective:  Vital signs in last 24 hours:  Temp:  [97.4 F (36.3 C)-98 F (36.7 C)] 98 F (36.7 C) (01/26 0402) Pulse Rate:  [68-73] 68 (01/26 0945) Resp:  [19-21] 19 (01/26 0945) BP: (147-154)/(63-80) 148/70 (01/26 0945) SpO2:  [94 %-98 %] 98 % (01/26 0945) Weight:  [76.1 kg] 76.1 kg (01/26 0402)  Weight change: 1.166 kg Filed Weights   06/27/18 0407 06/28/18 0349 06/29/18 0402  Weight: 72.7 kg 74.9 kg 76.1 kg    Intake/Output: I/O last 3 completed shifts: In: 72 [P.O.:720] Out: 1000 [Urine:1000]   Intake/Output this shift:  Total I/O In: 240 [P.O.:240] Out: -   Physical Exam: General: NAD,   Head: Normocephalic, atraumatic. Moist oral mucosal membranes  Eyes: Anicteric, PERRL  Neck: Supple, trachea midline  Lungs:  Clear to auscultation  Heart: Regular rate and rhythm  Abdomen:  Soft, nontender,   Extremities:  no peripheral edema.  Neurologic: Nonfocal, moving all four extremities  Skin: No lesions, hirsute        Basic Metabolic Panel: Recent Labs  Lab 06/26/18 1808 06/27/18 0401 06/27/18 0700 06/27/18 1010 06/28/18 0427 06/29/18 0321  NA 142 144  --   --  140 138  K 6.0* 5.6* 5.6* 5.3* 5.2* 4.8  CL 115* 115*  --   --  116* 110  CO2 20* 20*  --   --  20* 19*  GLUCOSE 196* 195*  --   --  324* 368*  BUN 69* 71*  --   --  73* 78*  CREATININE 3.55* 3.44*  --   --  3.35* 3.36*  CALCIUM 7.1* 7.4*  --   --  7.3* 7.3*    Liver Function Tests: Recent Labs  Lab 06/26/18 1808  AST 19  ALT 28  ALKPHOS 105  BILITOT 0.5  PROT 5.3*  ALBUMIN 2.1*   No results for input(s): LIPASE, AMYLASE in the last 168 hours. No results for input(s): AMMONIA in the last 168 hours.  CBC: Recent Labs  Lab 06/26/18 1808 06/28/18 0427  WBC 10.1  9.9  NEUTROABS 6.7  --   HGB 7.2* 7.2*  HCT 25.4* 25.1*  MCV 101.6* 101.2*  PLT 358 351    Cardiac Enzymes: Recent Labs  Lab 06/26/18 1808 06/26/18 2222 06/27/18 0401 06/27/18 1010  TROPONINI 0.04* 0.04* 0.04* 0.04*    BNP: Invalid input(s): POCBNP  CBG: Recent Labs  Lab 06/28/18 0756 06/28/18 1213 06/28/18 1608 06/28/18 2103 06/29/18 0728  GLUCAP 285* 272* 340* 390* 300*    Microbiology: Results for orders placed or performed during the hospital encounter of 05/29/18  MRSA PCR Screening     Status: None   Collection Time: 05/30/18  4:39 AM  Result Value Ref Range Status   MRSA by PCR NEGATIVE NEGATIVE Final    Comment:        The GeneXpert MRSA Assay (FDA approved for NASAL specimens only), is one component of a comprehensive MRSA colonization surveillance program. It is not intended to diagnose MRSA infection nor to guide or monitor treatment for MRSA infections. Performed at [facility], [address] [place], [place] [address]     Coagulation Studies: No results for input(s): LABPROT, INR in the last 72 hours.  Urinalysis:  Recent Labs    06/27/18 0954  COLORURINE YELLOW*  LABSPEC 1.014  PHURINE 5.0  GLUCOSEU >=500*  HGBUR SMALL*  BILIRUBINUR NEGATIVE  KETONESUR NEGATIVE  PROTEINUR 100*  NITRITE NEGATIVE  LEUKOCYTESUR NEGATIVE      Imaging: Dg Chest 1 View  Result Date: 06/28/2018 CLINICAL DATA:  Shortness of breath EXAM: CHEST  1 VIEW COMPARISON:  Two days ago FINDINGS: Moderate to large left pleural effusion. Improvement in interstitial opacity. Unchanged cardiomegaly that is partially obscured by pleural disease. IMPRESSION: 1. Probable improvement in pulmonary edema. 2. Continued moderate to large left pleural effusion. Electronically Signed   By: Monte Fantasia M.D.   On: 06/28/2018 07:29   US Renal  Result Date: 06/27/2018 CLINICAL DATA:  Acute kidney failure. EXAM: RENAL / URINARY TRACT ULTRASOUND COMPLETE  COMPARISON:  None. FINDINGS: Right Kidney: Renal measurements: 9.2 x 4.6 x 5.9 cm = volume: 131 mL. Diffuse increased echotexture of the kidney is noted. No mass or hydronephrosis visualized. Left Kidney: Renal measurements: 10.5 x 5.1 x 5.1 cm = volume: 144 mL. Diffuse increased echotexture of the kidney is noted. No mass or hydronephrosis visualized. Bladder: Appears normal for degree of bladder distention. Bilateral ureteral jets are noted. Incidental finding of bilateral pleural effusions and small ascites. IMPRESSION: Diffuse increased echotexture bilateral kidneys is can be seen in medical renal disease. No hydronephrosis is noted bilaterally. Bilateral pleural effusions and small ascites. Electronically Signed   By: Abelardo Diesel M.D.   On: 06/27/2018 10:59     Medications:    . amLODipine  10 mg Oral Daily  . atorvastatin  80 mg Oral Daily  . bumetanide  1 mg Oral BID  . carvedilol  25 mg Oral BID  . cholecalciferol  5,000 Units Oral Daily  . doxycycline  100 mg Oral Q12H  . epoetin (EPOGEN/PROCRIT) injection  10,000 Units Subcutaneous Q M,W,F  . heparin  5,000 Units Subcutaneous Q8H  . insulin aspart  0-15 Units Subcutaneous TID WC  . insulin aspart  0-5 Units Subcutaneous QHS  . ipratropium-albuterol  3 mL Nebulization TID  . isosorbide mononitrate  60 mg Oral Daily  . methylPREDNISolone (SOLU-MEDROL) injection  60 mg Intravenous Q12H  . sodium chloride flush  3 mL Intravenous Q12H   acetaminophen **OR** acetaminophen, guaiFENesin-dextromethorphan, hydrALAZINE, HYDROcodone-acetaminophen, loperamide, ondansetron **OR** ondansetron (ZOFRAN) IV, polyethylene glycol  Assessment/ Plan:  [name] is a 63 y.o. black female Jehovah  witness with hypertension, diabetes mellitus type II, congestive heart failure, anemia , who was admitted to [facility] on 06/26/2018 for acute congestive heart failure  1. Acute renal failure with hyperkalemia on chronic kidney disease stage IV: with  proteinuria.  Baseline creatinine of 2.64, GFR of 21. Follows with [facility].  Chronic kidney disease secondary to diabetes and hypertension.  Acute renal failure secondary to acute cardiorenal syndrome and hypertension urgency.  - holding benazepirl - Veltassa administered.   2. Hypertension: with chest pain on admission. Acute exacerbation of congestive heart failure.  Holding benazepril - started on isosorbide mononitrate on this admission - amlodipine, carvedilol, isosorbide mononitrate  - PO bumetanide, transition off IV furosemide.   3. Diabetes mellitus type II with chronic kidney disease: glucose controlled. Hemoglobin A1c of 5.5%.    LOS: 3 Terrin Meddaugh 1/26/20209:50 AM

## 2018-06-29 NOTE — Plan of Care (Signed)
Noncompliant with fluid restrictions/limitations.  Frequently requesting coffe and ice cream despite diet education.

## 2018-06-29 NOTE — Evaluation (Signed)
Physical Therapy Evaluation Patient Details Name: Jenny [name] MRN: [id number] DOB: 01/15/1956 Today's Date: 06/29/2018   History of Present Illness  63 year old female admitted for respiratory failure and SOB with 2L O2 chronically was desatting and up to 4L here.  Has anemia but will not accept a transfusion, also thoracentesis needed but also declined.  Has pleural effusion from CHF.  PMHx:  anemia, CHF, DM, HTN, CKD stage 4,    Clinical Impression  Pt was seen for mobility and to ck LE strength, O2 was stable on 3L in her room.  Pt was unable to stand or give meaningful effort with LE's to scoot up the bed so returned to bed with positioning for skin protection and comfort.  Follow acutely for strength, balance, endurance and monitoring of her vitals with O2 needed chronically for maintenance of sats.  Follow up with return to SNF, which pt is discouraged about given her return to hosp and feeling this puts her back at the beginning.  Talked with her about the plan to work here and beyond to return home as she is able.    Follow Up Recommendations SNF    Equipment Recommendations  None recommended by PT    Recommendations for Other Services       Precautions / Restrictions Precautions Precautions: Fall;Other (comment)(monitor vitals due to anemia) Precaution Comments: 97% sat on 2L O2 Restrictions Weight Bearing Restrictions: No      Mobility  Bed Mobility Overal bed mobility: Needs Assistance Bed Mobility: Supine to Sit;Sit to Supine     Supine to sit: Min assist Sit to supine: Min assist   General bed mobility comments: helped with legs back to bed and same to get out, along with light trunk support  Transfers Overall transfer level: Needs assistance Equipment used: Rolling walker (2 wheeled);1 person hand held assist Transfers: Sit to/from Stand;Lateral/Scoot Transfers          Lateral/Scoot Transfers: Total assist General transfer comment: unable to exert the  effort to do the stand up or scoot due to limited UE weakness   Ambulation/Gait             General Gait Details: unable  Stairs            Wheelchair Mobility    Modified Rankin (Stroke Patients Only)       Balance Overall balance assessment: Needs assistance Sitting-balance support: Feet supported Sitting balance-Leahy Scale: Fair                                       Pertinent Vitals/Pain Pain Assessment: No/denies pain    Home Living Family/patient expects to be discharged to:: Skilled nursing facility Living Arrangements: Alone Available Help at Discharge: Family;Available PRN/intermittently Type of Home: House       Home Layout: One level Home Equipment: Walker - 2 wheels      Prior Function Level of Independence: Independent with assistive device(s)         Comments: has not been up walking since a month ago when she got sick     Hand Dominance   Dominant Hand: Right    Extremity/Trunk Assessment   Upper Extremity Assessment Upper Extremity Assessment: Generalized weakness    Lower Extremity Assessment Lower Extremity Assessment: Generalized weakness    Cervical / Trunk Assessment Cervical / Trunk Assessment: Normal  Communication   Communication: No difficulties  Cognition  Arousal/Alertness: Awake/alert Behavior During Therapy: WFL for tasks assessed/performed Overall Cognitive Status: Within Functional Limits for tasks assessed                                        General Comments General comments (skin integrity, edema, etc.): pt was able to demonstrate 97% sat after sitting up and doing mm testing    Exercises     Assessment/Plan    PT Assessment Patient needs continued PT services  PT Problem List Decreased range of motion;Decreased strength;Decreased balance;Decreased activity tolerance;Decreased mobility;Decreased coordination;Decreased knowledge of use of DME;Decreased safety  awareness;Cardiopulmonary status limiting activity;Obesity       PT Treatment Interventions DME instruction;Gait training;Functional mobility training;Therapeutic activities;Therapeutic exercise;Balance training;Neuromuscular re-education;Patient/family education    PT Goals (Current goals can be found in the Care Plan section)  Acute Rehab PT Goals Patient Stated Goal: to get stronger and return home PT Goal Formulation: With patient Time For Goal Achievement: 07/13/18 Potential to Achieve Goals: Good    Frequency Min 2X/week   Barriers to discharge Decreased caregiver support home alone    Co-evaluation               AM-PAC PT "6 Clicks" Mobility  Outcome Measure Help needed turning from your back to your side while in a flat bed without using bedrails?: A Little Help needed moving from lying on your back to sitting on the side of a flat bed without using bedrails?: A Lot Help needed moving to and from a bed to a chair (including a wheelchair)?: A Lot Help needed standing up from a chair using your arms (e.g., wheelchair or bedside chair)?: A Lot Help needed to walk in hospital room?: Total Help needed climbing 3-5 steps with a railing? : Total 6 Click Score: 11    End of Session Equipment Utilized During Treatment: Gait belt;Oxygen Activity Tolerance: Patient limited by fatigue;Treatment limited secondary to medical complications (Comment) Patient left: in bed;with call bell/phone within reach;with bed alarm set Nurse Communication: Mobility status PT Visit Diagnosis: Unsteadiness on feet (R26.81);Muscle weakness (generalized) (M62.81);Difficulty in walking, not elsewhere classified (R26.2);Adult, failure to thrive (R62.7)    Time: 2542-7062 PT Time Calculation (min) (ACUTE ONLY): 27 min   Charges:   PT Evaluation $PT Eval Moderate Complexity: 1 Mod PT Treatments $Therapeutic Activity: 8-22 mins       Ramond Dial 06/29/2018, 3:13 PM   Mee Hives, PT  MS Acute Rehab Dept. Number: [place] [facility] [place]

## 2018-06-29 NOTE — Plan of Care (Signed)
  Problem: Clinical Measurements: Goal: Ability to maintain clinical measurements within normal limits will improve Outcome: Not Progressing Note:  Most recent G.F.R. value is critically [name] at only 16. Will continue to monitor renal function. Jenny [name] [facility]

## 2018-06-30 LAB — GLUCOSE, CAPILLARY
Glucose-Capillary: 196 mg/dL — ABNORMAL HIGH (ref 70–99)
Glucose-Capillary: 194 mg/dL — ABNORMAL HIGH (ref 70–99)
Glucose-Capillary: 252 mg/dL — ABNORMAL HIGH (ref 70–99)
Glucose-Capillary: 316 mg/dL — ABNORMAL HIGH (ref 70–99)

## 2018-06-30 LAB — BASIC METABOLIC PANEL
Anion gap: 7 (ref 5–15)
BUN: 86 mg/dL — ABNORMAL HIGH (ref 8–23)
CO2: 20 mmol/L — ABNORMAL LOW (ref 22–32)
Calcium: 7.3 mg/dL — ABNORMAL LOW (ref 8.9–10.3)
Chloride: 111 mmol/L (ref 98–111)
Creatinine, Ser: 3.5 mg/dL — ABNORMAL HIGH (ref 0.44–1.00)
GFR calc Af Amer: 15 mL/min — ABNORMAL LOW (ref >60)
GFR calc non Af Amer: 13 mL/min — ABNORMAL LOW (ref >60)
Glucose, Bld: 250 mg/dL — ABNORMAL HIGH (ref 70–99)
Potassium: 4.6 mmol/L (ref 3.5–5.1)
SODIUM: 138 mmol/L (ref 135–145)

## 2018-06-30 MED ORDER — DOXYCYCLINE HYCLATE 100 MG PO TABS
100.0000 mg | ORAL_TABLET | Freq: Two times a day (BID) | ORAL | Status: DC
  Administered 2018-06-30 – 2018-07-01 (×3): 100 mg via ORAL
  Filled 2018-06-30 (×3): qty 1

## 2018-06-30 NOTE — Progress Notes (Addendum)
Ch visited w/ pt who has been challenged w/ breathing. Pt shared that she has improved a lot since being here. Pt allowed ch to visit and assit her with getting help from a NT to turn her to prevent bedsores. Pt was thankful for ch help. 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 Pt shared that she has done several jobs in healthcare but said that moving to [employer] was an answered prayer. Pt spoke about Eccle 1:9 "there's nothing new under the sun" while looking at the impeachment trial. Pt is directly impacted by the changes in healthcare so the trial has greater meaning to her than most. Ch was presnet why the massage therapist gave pt a hand massage. This was a moment where I see a glimmer of joy in the pt. Ch remained in the room until pt drifted off to rest.      06/30/18 1400  Clinical Encounter Type  Visited With Patient  Visit Type Initial;Psychological support;Spiritual support;Social support  Referral From Chaplain  Consult/Referral To Chaplain  Spiritual Encounters  Spiritual Needs Literature;Emotional;Grief support  Stress Factors  Patient Stress Factors Health changes;Major life changes  Family Stress Factors None identified

## 2018-06-30 NOTE — Progress Notes (Signed)
[facility]  ROUNDING NOTE   Subjective:   Urine output 650 cc Patient continues to have lower extremity edema  Objective:  Vital signs in last 24 hours:  Temp:  [97.6 F (36.4 C)] 97.6 F (36.4 C) (01/27 0329) Pulse Rate:  [73] 73 (01/27 0329) Resp:  [18] 18 (01/27 0329) BP: (134-151)/(62-72) 151/72 (01/27 0329) SpO2:  [95 %-96 %] 95 % (01/27 0329) FiO2 (%):  [28 %] 28 % (01/26 2001) Weight:  [78.1 kg] 78.1 kg (01/27 0329)  Weight change: 2 kg Filed Weights   06/28/18 0349 06/29/18 0402 06/30/18 0329  Weight: 74.9 kg 76.1 kg 78.1 kg    Intake/Output: I/O last 3 completed shifts: In: 62 [P.O.:720] Out: 1150 [Urine:1150]   Intake/Output this shift:  Total I/O In: 120 [P.O.:120] Out: -   Physical Exam: General: NAD,   Head: Normocephalic, atraumatic. Moist oral mucosal membranes  Eyes: Anicteric  Neck: Supple  Lungs:  Clear to auscultation  Heart: Regular rate and rhythm  Abdomen:  Soft, nontender,   Extremities:  no peripheral edema.  Neurologic:  Alert and able to answer questions appropriately   Skin: No lesions, hirsute        Basic Metabolic Panel: Recent Labs  Lab 06/26/18 1808 06/27/18 0401 06/27/18 0700 06/27/18 1010 06/28/18 0427 06/29/18 0321 06/30/18 0500  NA 142 144  --   --  140 138 138  K 6.0* 5.6* 5.6* 5.3* 5.2* 4.8 4.6  CL 115* 115*  --   --  116* 110 111  CO2 20* 20*  --   --  20* 19* 20*  GLUCOSE 196* 195*  --   --  324* 368* 250*  BUN 69* 71*  --   --  73* 78* 86*  CREATININE 3.55* 3.44*  --   --  3.35* 3.36* 3.50*  CALCIUM 7.1* 7.4*  --   --  7.3* 7.3* 7.3*    Liver Function Tests: Recent Labs  Lab 06/26/18 1808  AST 19  ALT 28  ALKPHOS 105  BILITOT 0.5  PROT 5.3*  ALBUMIN 2.1*   No results for input(s): LIPASE, AMYLASE in the last 168 hours. No results for input(s): AMMONIA in the last 168 hours.  CBC: Recent Labs  Lab 06/26/18 1808 06/28/18 0427  WBC 10.1 9.9  NEUTROABS 6.7  --   HGB 7.2*  7.2*  HCT 25.4* 25.1*  MCV 101.6* 101.2*  PLT 358 351    Cardiac Enzymes: Recent Labs  Lab 06/26/18 1808 06/26/18 2222 06/27/18 0401 06/27/18 1010  TROPONINI 0.04* 0.04* 0.04* 0.04*    BNP: Invalid input(s): POCBNP  CBG: Recent Labs  Lab 06/29/18 0728 06/29/18 1202 06/29/18 1630 06/29/18 2102 06/30/18 0741  GLUCAP 300* 347* 323* 283* 196*    Microbiology: Results for orders placed or performed during the hospital encounter of 06/26/18  MRSA PCR Screening     Status: None   Collection Time: 06/29/18  2:57 AM  Result Value Ref Range Status   MRSA by PCR NEGATIVE NEGATIVE Final    Comment:        The GeneXpert MRSA Assay (FDA approved for NASAL specimens only), is one component of a comprehensive MRSA colonization surveillance program. It is not intended to diagnose MRSA infection nor to guide or monitor treatment for MRSA infections. Performed at [facility], [address] [place], [place] [address]     Coagulation Studies: No results for input(s): LABPROT, INR in the last 72 hours.  Urinalysis: No results  for input(s): COLORURINE, LABSPEC, [place], GLUCOSEU, HGBUR, BILIRUBINUR, KETONESUR, PROTEINUR, UROBILINOGEN, NITRITE, LEUKOCYTESUR in the last 72 hours.  Invalid input(s): APPERANCEUR    Imaging: No results found.   Medications:    . amLODipine  10 mg Oral Daily  . atorvastatin  80 mg Oral Daily  . bumetanide  1 mg Oral BID  . carvedilol  25 mg Oral BID  . cholecalciferol  5,000 Units Oral Daily  . doxycycline  100 mg Oral Q12H  . epoetin (EPOGEN/PROCRIT) injection  10,000 Units Subcutaneous Q M,W,F  . heparin  5,000 Units Subcutaneous Q8H  . insulin aspart  0-15 Units Subcutaneous TID WC  . insulin aspart  0-5 Units Subcutaneous QHS  . ipratropium-albuterol  3 mL Nebulization TID  . isosorbide mononitrate  60 mg Oral Daily  . predniSONE  50 mg Oral Q breakfast  . sodium chloride flush  3 mL Intravenous Q12H    acetaminophen **OR** acetaminophen, guaiFENesin-dextromethorphan, hydrALAZINE, HYDROcodone-acetaminophen, loperamide, ondansetron **OR** ondansetron (ZOFRAN) IV, polyethylene glycol  Assessment/ Plan:  Jenny [name] is a 63 y.o. black female Jehovah  witness with hypertension, diabetes mellitus type II, congestive heart failure, anemia , who was admitted to [facility] on 06/26/2018 for acute congestive heart failure  1. Acute renal failure with hyperkalemia on chronic kidney disease stage IV: with proteinuria.  Baseline creatinine of 2.64, GFR of 21. Follows with [facility].  Chronic kidney disease secondary to diabetes and hypertension.  Acute renal failure secondary to acute cardiorenal syndrome and hypertension urgency.  - holding benazepirl -Serum remains elevated at 3.50.  Continue to monitor -Urine output 650 cc  2. Diabetes mellitus type II with chronic kidney disease: glucose controlled.  Hemoglobin A1c of 5.5%.    LOS: 4 Demaurion Dicioccio 1/27/202011:55 AM

## 2018-06-30 NOTE — Progress Notes (Addendum)
[facility] [place] NAME: Jenny [name]    MR#:  [id number]  DATE OF BIRTH:  29-Dec-1955  SUBJECTIVE:  CHIEF COMPLAINT:   Chief Complaint  Patient presents with  . Shortness of Breath   - doing well, no complaints -waiting insurance auth  REVIEW OF SYSTEMS:  Review of Systems  Constitutional: Negative for chills, fever and malaise/fatigue.  Respiratory: Negative for cough, shortness of breath and wheezing.   Cardiovascular: Negative for chest pain, palpitations and leg swelling.  Gastrointestinal: Negative for abdominal pain, constipation, diarrhea, nausea and vomiting.  Genitourinary: Negative for dysuria.  Musculoskeletal: Negative for myalgias.  Neurological: Negative for dizziness, focal weakness, seizures, weakness and headaches.  Psychiatric/Behavioral: Negative for depression.    DRUG ALLERGIES:   Allergies  Allergen Reactions  . Penicillins Rash    VITALS:  Blood pressure (!) 151/72, pulse 73, temperature 97.6 F (36.4 C), resp. rate 18, height 5\' 5"  (1.651 m), weight 78.1 kg, SpO2 95 %.  PHYSICAL EXAMINATION:  Physical Exam   GENERAL:  63 y.o.-year-old patient lying in the bed with no acute distress.  EYES: Pupils equal, round, reactive to light and accommodation. No scleral icterus. Extraocular muscles intact.  HEENT: Head atraumatic, normocephalic. Oropharynx and nasopharynx clear.  NECK:  Supple, no jugular venous distention. No thyroid enlargement, no tenderness.  LUNGS: Normal breath sounds bilaterally, no wheezing, rales,rhonchi or crepitation. No use of accessory muscles of respiration. Decreased bibasilar breath sounds CARDIOVASCULAR: S1, S2 normal. No  rubs, or gallops. 2/6 systolic murmur present ABDOMEN: Soft, nontender, nondistended. Bowel sounds present. No organomegaly or mass.  EXTREMITIES: No  cyanosis, or clubbing. 1+ pedal edema noted. NEUROLOGIC: Cranial nerves II through XII are intact. Muscle  strength 5/5 in all extremities. Sensation intact. Gait not checked. Global weakness noted. PSYCHIATRIC: The patient is alert and oriented x 3.  SKIN: No obvious rash, lesion, or ulcer.    LABORATORY PANEL:   CBC Recent Labs  Lab 06/28/18 0427  WBC 9.9  HGB 7.2*  HCT 25.1*  PLT 351   ------------------------------------------------------------------------------------------------------------------  Chemistries  Recent Labs  Lab 06/26/18 1808  06/30/18 0500  NA 142   < > 138  K 6.0*   < > 4.6  CL 115*   < > 111  CO2 20*   < > 20*  GLUCOSE 196*   < > 250*  BUN 69*   < > 86*  CREATININE 3.55*   < > 3.50*  CALCIUM 7.1*   < > 7.3*  AST 19  --   --   ALT 28  --   --   ALKPHOS 105  --   --   BILITOT 0.5  --   --    < > = values in this interval not displayed.   ------------------------------------------------------------------------------------------------------------------  Cardiac Enzymes Recent Labs  Lab 06/27/18 1010  TROPONINI 0.04*   ------------------------------------------------------------------------------------------------------------------  RADIOLOGY:  No results found.  EKG:   Orders placed or performed during the hospital encounter of 06/26/18  . EKG 12-Lead  . EKG 12-Lead  . EKG 12-Lead  . EKG 12-Lead    ASSESSMENT AND PLAN:   63 year old female past medical history significant for chronic respiratory failure secondary to COPD and CHF on home oxygen, CKD stage IV, diabetes and hypertension presents to hospital from [facility] secondary to respiratory failure.  1.  Acute on chronic hypoxic respiratory failure-secondary to COPD exacerbation and left pleural effusion -Chronically on home oxygen, currently on  2 L O2 -Much improving breathing.  IV steroids to oral prednisone. -On doxycycline orally. -Continue nebs and inhalers -Has moderate to large pleural effusion, however patient refuses thoracentesis.  2.  Acute on chronic combined CHF  exacerbation-most recent echocardiogram with EF of 40 to 25% and diastolic dysfunction -On oral Bumex -Continue strict input and output monitoring  3.  Acute renal failure on CKD stage IV-appreciate nephrology consult. -Readmission stable with low GFR still. -Patient follows with [facility] nephrology.  Continues to make urine.  Continue outpatient follow-up and avoid nephrotoxic agents -Renal ultrasound with increased echotexture of bilateral kidneys  4.  Hyperkalemia-resolved with V[place]assa  5.  Chronic microcytic anemia-patient is a Jehovah's Witness.  Hemoglobin is stable and at 7 -Epogen given by nephrology team  6.  Hypertension-patient on Imdur, Coreg and Norvasc  7.  DVT prophylaxis-on subcutaneous heparin   Once insurance authorization received, discharge  to rehab today    All the records are reviewed and case discussed with Care Management/Social Workerr. Management plans discussed with the patient, family and they are in agreement.  CODE STATUS: Full Code  TOTAL TIME TAKING CARE OF THIS PATIENT: 38 minutes.   POSSIBLE D/C IN 1 DAY , DEPENDING ON CLINICAL CONDITION.   Gladstone Lighter M.D on 06/30/2018 at 9:30 AM  Between 7am to 6pm - Pager - [phone]  After 6pm go to [email or web address] - password [email or web address] [place] [facility]  Office  [phone]  CC: Primary care physician; Juluis Pitch, MD

## 2018-07-01 LAB — BASIC METABOLIC PANEL
Anion gap: 8 (ref 5–15)
BUN: 92 mg/dL — ABNORMAL HIGH (ref 8–23)
CO2: 19 mmol/L — ABNORMAL LOW (ref 22–32)
Calcium: 7.2 mg/dL — ABNORMAL LOW (ref 8.9–10.3)
Chloride: 111 mmol/L (ref 98–111)
Creatinine, Ser: 3.42 mg/dL — ABNORMAL HIGH (ref 0.44–1.00)
GFR calc Af Amer: 16 mL/min — ABNORMAL LOW (ref >60)
GFR calc non Af Amer: 14 mL/min — ABNORMAL LOW (ref >60)
Glucose, Bld: 211 mg/dL — ABNORMAL HIGH (ref 70–99)
Potassium: 4.7 mmol/L (ref 3.5–5.1)
Sodium: 138 mmol/L (ref 135–145)

## 2018-07-01 LAB — GLUCOSE, CAPILLARY
Glucose-Capillary: 174 mg/dL — ABNORMAL HIGH (ref 70–99)
Glucose-Capillary: 202 mg/dL — ABNORMAL HIGH (ref 70–99)
Glucose-Capillary: 291 mg/dL — ABNORMAL HIGH (ref 70–99)

## 2018-07-01 MED ORDER — BUMETANIDE 1 MG PO TABS: 1.0000 mg | ORAL_TABLET | Freq: Two times a day (BID) | ORAL | 1 refills | Status: AC

## 2018-07-01 MED ORDER — AMLODIPINE BESYLATE 10 MG PO TABS: 10.0000 mg | ORAL_TABLET | Freq: Every day | ORAL | 2 refills | Status: DC

## 2018-07-01 MED ORDER — PREDNISONE 20 MG PO TABS: 40.0000 mg | ORAL_TABLET | Freq: Every day | ORAL | 0 refills | Status: AC

## 2018-07-01 MED ORDER — ALBUMIN HUMAN 25 % IV SOLN
12.5000 g | Freq: Once | INTRAVENOUS | Status: AC
  Administered 2018-07-01: 12.5 g via INTRAVENOUS
  Filled 2018-07-01: qty 50

## 2018-07-01 MED ORDER — POLYETHYLENE GLYCOL 3350 17 G PO PACK: 17.0000 g | PACK | Freq: Every day | ORAL | 0 refills | Status: AC | PRN

## 2018-07-01 MED ORDER — ACETAMINOPHEN 325 MG PO TABS: 650.0000 mg | ORAL_TABLET | Freq: Four times a day (QID) | ORAL | Status: AC | PRN

## 2018-07-01 MED ORDER — DOXYCYCLINE HYCLATE 100 MG PO TABS: 100.0000 mg | ORAL_TABLET | Freq: Two times a day (BID) | ORAL | 0 refills | Status: AC

## 2018-07-01 MED ORDER — IPRATROPIUM-ALBUTEROL 0.5-2.5 (3) MG/3ML IN SOLN: 3.0000 mL | Freq: Four times a day (QID) | RESPIRATORY_TRACT | 0 refills | Status: AC | PRN

## 2018-07-01 NOTE — Clinical Social Work Note (Addendum)
Patient to be d/c'ed today to [facility] room 704. Patient and family agreeable to plans will transport via ems RN to call report [phone].  3:00pm  CSW was informed that patient was saying she did not want to return back to [facility] and wanted CSW to look at other facilities.  CSW asked patient the reason she did not want to return, and she stated she had some concerns about her that she was receiving.  CSW discussed with her the concerns that she had, CSW gave the information to [facility] as well so they can discuss with her.  Otila Kluver in admissions said she will relay the information to Clinical biochemist of nursing.  Patient called her son and talked to him about her concerns, and he told her that he will talk with [facility] about them as well.  Patient then agreed to return back to [facility], CSW updated bedside nurse.  5:00pm  CSW received an email that patient's son Nicole Kindred [phone] wanted to speak with CSW about patient discharging back to [facility].  CSW talked with patient's son, and told him that this CSW expressed patient's concerns to [facility] so they can try to address the issues.  [name] also requested that patient's AVS be emailed to him, patient gave CSW permission to send information to her son.  [name] asked if a Education officer, museum can help patient apply for Medicare and Medicaid, CSW informed him that the social worker at [facility] can assist with helping patient go through the application process.  [name] did not express any other questions or concerns, patient is discharging back to [facility] today via EMS.  Evette Cristal, MSW, [place]

## 2018-07-01 NOTE — Discharge Summary (Signed)
[facility] [facility] [place] NAME: Jenny [name]    MR#:  [id number]  DATE OF BIRTH:  [birth year]  DATE OF ADMISSION:  06/26/2018   ADMITTING PHYSICIAN: Gorden Harms, MD  DATE OF DISCHARGE:  07/01/18  PRIMARY CARE PHYSICIAN: Juluis Pitch, MD   ADMISSION DIAGNOSIS:   Hyperkalemia [E87.5] Hypoxia [R09.02] Acute renal failure superimposed on chronic kidney disease, unspecified CKD stage, unspecified acute renal failure type ([place]) [N17.9, N18.9] Acute on chronic congestive heart failure, unspecified heart failure type ([place]) [I50.9]  DISCHARGE DIAGNOSIS:   Active Problems:   Respiratory failure ([place])   SECONDARY DIAGNOSIS:   Past Medical History:  Diagnosis Date  . Anemia   . CHF (congestive heart failure) ([place])   . CKD (chronic kidney disease) stage 4, GFR 15-29 ml/min (H[facility])   . Diabetes mellitus without complication ([place])   . Hypertension   . Patient is [place] COURSE:   63 year old female past medical history significant for chronic respiratory failure secondary to COPD and CHF on home oxygen, CKD stage IV, diabetes and hypertension presents [facility] secondary to respiratory failure.  1.  Acute on chronic hypoxic respiratory failure-secondary to COPD exacerbation and left pleural effusion -Chronically on home oxygen, currently on 2 L O2 -Much improving breathing.  IV steroids to oral prednisone for 5 more days. -On doxycycline orally. -Continue nebs and inhalers -Has moderate to large pleural effusion, however patient refuses thoracentesis.  2.  Acute on chronic combined CHF exacerbation-most recent echocardiogram with EF of 40 to 85% and diastolic dysfunction -On oral Bumex -Continue strict input and output monitoring  3.  Acute renal failure on CKD stage IV-appreciate nephrology consult. -Readmission stable with low GFR still. -Patient follows with [facility] nephrology.   Continues to make urine.  Continue outpatient follow-up and avoid nephrotoxic agents -Renal ultrasound with increased echotexture of bilateral kidneys  4.  Hyperkalemia-resolved with V[place]  5.  Chronic microcytic anemia-patient is a Jehovah's Witness.  Hemoglobin is stable and at 7 -Epogen given by nephrology team- continue weekly  6.  Hypertension-patient on Imdur, Coreg and Norvasc   discharge  to rehab today    DISCHARGE CONDITIONS:   Guarded  CONSULTS OBTAINED:   Treatment Team:  Lavonia Dana, MD  DRUG ALLERGIES:   Allergies  Allergen Reactions  . Penicillins Rash   DISCHARGE MEDICATIONS:   Allergies as of 07/01/2018      Reactions   Penicillins Rash      Medication List    TAKE these medications   acetaminophen 325 MG tablet Commonly known as:  TYLENOL Take 2 tablets (650 mg total) by mouth every 6 (six) hours as needed for mild pain (or Fever >/= 101).   amLODipine 10 MG tablet Commonly known as:  NORVASC Take 1 tablet (10 mg total) by mouth daily. Start taking on:  July 02, 2018 What changed:    medication strength  how much to take   atorvastatin 80 MG tablet Commonly known as:  LIPITOR Take 1 tablet (80 mg total) by mouth daily.   bumetanide 1 MG tablet Commonly known as:  BUMEX Take 1 tablet (1 mg total) by mouth 2 (two) times daily.   carvedilol 25 MG tablet Commonly known as:  COREG Take 1 tablet by mouth 2 (two) times daily.   cholecalciferol 25 MCG (1000 UT) tablet Commonly known as:  VITAMIN D Take 5,000 Units by mouth daily.  dextromethorphan-guaiFENesin 30-600 MG 12hr tablet Commonly known as:  MUCINEX DM Take 1 tablet by mouth 2 (two) times daily as needed for cough.   doxycycline 100 MG tablet Commonly known as:  VIBRA-TABS Take 1 tablet (100 mg total) by mouth 2 (two) times daily with a meal for 3 days.   epoetin alfa-epbx 10000 UNIT/ML injection Commonly known as:  RETACRIT 15,000 Units every Friday.     ipratropium-albuterol 0.5-2.5 (3) MG/3ML Soln Commonly known as:  DUONEB Take 3 mLs by nebulization every 6 (six) hours as needed (wheezing, shortness of breath).   isosorbide mononitrate 60 MG 24 hr tablet Commonly known as:  IMDUR Take 1 tablet (60 mg total) by mouth daily.   loperamide 2 MG capsule Commonly known as:  IMODIUM Take 2 mg by mouth every 6 (six) hours as needed for diarrhea or loose stools.   polyethylene glycol packet Commonly known as:  MIRALAX / GLYCOLAX Take 17 g by mouth daily as needed for mild constipation.   predniSONE 20 MG tablet Commonly known as:  DELTASONE Take 2 tablets (40 mg total) by mouth daily for 5 days.        DISCHARGE INSTRUCTIONS:   1.  PCP follow-up in 1 to 2 weeks 2.  Nephrology follow-up within 1 week 3.  Pulmonary follow-up in 1 to 2 weeks  DIET:   Cardiac diet  ACTIVITY:   Activity as tolerated  OXYGEN:   Home Oxygen: Yes.    Oxygen Delivery: 2 liters/min via Patient connected to nasal cannula oxygen  DISCHARGE LOCATION:   nursing home   If you experience worsening of your admission symptoms, develop shortness of breath, life threatening emergency, suicidal or homicidal thoughts you must seek medical attention immediately by calling 911 or calling your MD immediately  if symptoms less severe.  You Must read complete instructions/literature along with all the possible adverse reactions/side effects for all the Medicines you take and that have been prescribed to you. Take any new Medicines after you have completely understood and accpet all the possible adverse reactions/side effects.   Please note  You were cared for by a hospitalist during your hospital stay. If you have any questions about your discharge medications or the care you received while you were in the hospital after you are discharged, you can call the unit and asked to speak with the hospitalist on call if the hospitalist that took care of you is not  available. Once you are discharged, your primary care physician will handle any further medical issues. Please note that NO REFILLS for any discharge medications will be authorized once you are discharged, as it is imperative that you return to your primary care physician (or establish a relationship with a primary care physician if you do not have one) for your aftercare [name] so that they can reassess your need for medications and monitor your lab values.    On the day of Discharge:  VITAL SIGNS:   Blood pressure (!) 143/77, pulse 70, temperature (!) 97.4 F (36.3 C), temperature source Oral, resp. rate 19, height 5\' 5"  (1.651 m), weight 82.9 kg, SpO2 98 %.  PHYSICAL EXAMINATION:     GENERAL:  63 y.o.-year-old patient lying in the bed with no acute distress.  EYES: Pupils equal, round, reactive to light and accommodation. No scleral icterus. Extraocular muscles intact.  HEENT: Head atraumatic, normocephalic. Oropharynx and nasopharynx clear.  NECK:  Supple, no jugular venous distention. No thyroid enlargement, no tenderness.  LUNGS: Normal breath  sounds bilaterally, no wheezing, rales,rhonchi or crepitation. No use of accessory muscles of respiration. Decreased bibasilar breath sounds CARDIOVASCULAR: S1, S2 normal. No  rubs, or gallops. 2/6 systolic murmur present ABDOMEN: Soft, nontender, nondistended. Bowel sounds present. No organomegaly or mass.  EXTREMITIES: No  cyanosis, or clubbing. 1+ pedal edema noted. NEUROLOGIC: Cranial nerves II through XII are intact. Muscle strength 5/5 in all extremities. Sensation intact. Gait not checked. Global weakness noted. PSYCHIATRIC: The patient is alert and oriented x 3.  SKIN: No obvious rash, lesion, or ulcer.   DATA REVIEW:   CBC Recent Labs  Lab 06/28/18 0427  WBC 9.9  HGB 7.2*  HCT 25.1*  PLT 351    Chemistries  Recent Labs  Lab 06/26/18 1808  07/01/18 0534  NA 142   < > 138  K 6.0*   < > 4.7  CL 115*   < > 111  CO2 20*   <  > 19*  GLUCOSE 196*   < > 211*  BUN 69*   < > 92*  CREATININE 3.55*   < > 3.42*  CALCIUM 7.1*   < > 7.2*  AST 19  --   --   ALT 28  --   --   ALKPHOS 105  --   --   BILITOT 0.5  --   --    < > = values in this interval not displayed.     Microbiology Results  Results for orders placed or performed during the hospital encounter of 06/26/18  MRSA PCR Screening     Status: None   Collection Time: 06/29/18  2:57 AM  Result Value Ref Range Status   MRSA by PCR NEGATIVE NEGATIVE Final    Comment:        The GeneXpert MRSA Assay (FDA approved for NASAL specimens only), is one component of a comprehensive MRSA colonization surveillance program. It is not intended to diagnose MRSA infection nor to guide or monitor treatment for MRSA infections. Performed at [facility], [address], [place], [place] [address]     RADIOLOGY:  No results found.   Management plans discussed with the patient, family and they are in agreement.  CODE STATUS:     Code Status Orders  (From admission, onward)         Start     Ordered   06/26/18 2216  Full code  Continuous     06/26/18 2215        Code Status History    Date Active Date Inactive Code Status Order ID Comments User Context   05/29/2018 2023 05/30/2018 2034 Full Code [id number]  Hillary Bow, MD ED      TOTAL TIME TAKING CARE OF THIS PATIENT: 38  minutes.    Gladstone Lighter M.D on 07/01/2018 at 11:53 AM  Between 7am to 6pm - Pager - [phone]  After 6pm go to [email or web address] - Proofreader  [facility]  Office  [phone]  CC: Primary care physician; Juluis Pitch, MD   Note: This dictation was prepared with Dragon dictation along with smaller phrase technology. Any transcriptional errors that result from this process are unintentional.

## 2018-07-01 NOTE — Care Management (Signed)
During leader rounding, patient shared that she would like to apply for disability and Medicare. I have left message for Suanne Marker with financial assistance [phone].

## 2018-07-01 NOTE — Progress Notes (Signed)
Inpatient Diabetes Program Recommendations  AACE/ADA: New Consensus Statement on Inpatient Glycemic Control (2015)  Target Ranges:  Prepandial:   less than 140 mg/dL      Peak postprandial:   less than 180 mg/dL (1-2 hours)      Critically ill patients:  140 - 180 mg/dL   Results for Jenny [name], Jenny [name] (MRN [id number]) as of 07/01/2018 10:39  Ref. Range 06/30/2018 07:41 06/30/2018 12:02 06/30/2018 16:42 06/30/2018 20:40  Glucose-Capillary Latest Ref Range: 70 - 99 mg/dL 196 (H)  3 units NOVOLOG  194 (H)  3 units NOVOLOG  252 (H)  8 units NOVOLOG  316 (H)  4 units NOVOLOG    Results for Jenny [name], Jenny [name] (MRN [id number]) as of 07/01/2018 10:39  Ref. Range 07/01/2018 08:09  Glucose-Capillary Latest Ref Range: 70 - 99 mg/dL 174 (H)  3 units NOVOLOG      Home DM Meds: None listed  Current Orders: Novolog Moderate Correction Scale/ SSI (0-15 units) TID AC + HS     Last dose Solumedrol given 10am on 01/26.  Now getting Prednisone 50 mg Daily.    MD- Note patient having issues with elevated late afternoon CBGs likely due to the Prednisone.  Please consider adding Novolog Meal Coverage while patient getting Prednisone:  Novolog 4 units TID with meals  (Please add the following Hold Parameters: Hold if pt eats <50% of meal, Hold if pt NPO)    --Will follow patient during hospitalization--  Wyn Quaker RN, MSN, CDE Diabetes Coordinator Inpatient Glycemic Control Team Team Pager: [phone] (8a-5p)

## 2018-07-01 NOTE — Progress Notes (Signed)
Patient d/c'd to [facility] via EMS this evening. No complaints, VSS, IV removed.

## 2018-07-01 NOTE — Progress Notes (Signed)
[facility]  ROUNDING NOTE   Subjective:   Urine output 300 cc Patient continues to have lower extremity edema Tolerated bumetanide  Objective:  Vital signs in last 24 hours:  Temp:  [97.4 F (36.3 C)-99.2 F (37.3 C)] 97.4 F (36.3 C) (01/28 0808) Pulse Rate:  [70-76] 70 (01/28 0808) Resp:  [19] 19 (01/28 0349) BP: (131-143)/(73-77) 143/77 (01/28 0808) SpO2:  [96 %-98 %] 98 % (01/28 0808) Weight:  [82.9 kg] 82.9 kg (01/28 0406)  Weight change: 4.817 kg Filed Weights   06/29/18 0402 06/30/18 0329 07/01/18 0406  Weight: 76.1 kg 78.1 kg 82.9 kg    Intake/Output: I/O last 3 completed shifts: In: 480 [P.O.:480] Out: 950 [Urine:950]   Intake/Output this shift:  No intake/output data recorded.  Physical Exam: General: NAD,   Head: Normocephalic, atraumatic. Moist oral mucosal membranes  Eyes: Anicteric  Neck: Supple  Lungs:  Clear to auscultation  Heart: Regular rate and rhythm  Abdomen:  Soft, nontender,   Extremities:  2++ peripheral edema.  Up to lower abdomen  Neurologic:  Alert and able to answer questions appropriately   Skin: No lesions, hirsute        Basic Metabolic Panel: Recent Labs  Lab 06/27/18 0401  06/27/18 1010 06/28/18 0427 06/29/18 0321 06/30/18 0500 07/01/18 0534  NA 144  --   --  140 138 138 138  K 5.6*   < > 5.3* 5.2* 4.8 4.6 4.7  CL 115*  --   --  116* 110 111 111  CO2 20*  --   --  20* 19* 20* 19*  GLUCOSE 195*  --   --  324* 368* 250* 211*  BUN 71*  --   --  73* 78* 86* 92*  CREATININE 3.44*  --   --  3.35* 3.36* 3.50* 3.42*  CALCIUM 7.4*  --   --  7.3* 7.3* 7.3* 7.2*   < > = values in this interval not displayed.    Liver Function Tests: Recent Labs  Lab 06/26/18 1808  AST 19  ALT 28  ALKPHOS 105  BILITOT 0.5  PROT 5.3*  ALBUMIN 2.1*   No results for input(s): LIPASE, AMYLASE in the last 168 hours. No results for input(s): AMMONIA in the last 168 hours.  CBC: Recent Labs  Lab 06/26/18 1808  06/28/18 0427  WBC 10.1 9.9  NEUTROABS 6.7  --   HGB 7.2* 7.2*  HCT 25.4* 25.1*  MCV 101.6* 101.2*  PLT 358 351    Cardiac Enzymes: Recent Labs  Lab 06/26/18 1808 06/26/18 2222 06/27/18 0401 06/27/18 1010  TROPONINI 0.04* 0.04* 0.04* 0.04*    BNP: Invalid input(s): POCBNP  CBG: Recent Labs  Lab 06/30/18 0741 06/30/18 1202 06/30/18 1642 06/30/18 2040 07/01/18 0809  GLUCAP 196* 194* 252* 316* 174*    Microbiology: Results for orders placed or performed during the hospital encounter of 06/26/18  MRSA PCR Screening     Status: None   Collection Time: 06/29/18  2:57 AM  Result Value Ref Range Status   MRSA by PCR NEGATIVE NEGATIVE Final    Comment:        The GeneXpert MRSA Assay (FDA approved for NASAL specimens only), is one component of a comprehensive MRSA colonization surveillance program. It is not intended to diagnose MRSA infection nor to guide or monitor treatment for MRSA infections. Performed at [facility], [address] [place], [place] [address]     Coagulation Studies: No results for input(s): LABPROT,  INR in the last 72 hours.  Urinalysis: No results for input(s): COLORURINE, LABSPEC, PHURINE, GLUCOSEU, HGBUR, BILIRUBINUR, KETONESUR, PROTEINUR, UROBILINOGEN, NITRITE, LEUKOCYTESUR in the last 72 hours.  Invalid input(s): APPERANCEUR    Imaging: No results found.   Medications:    . amLODipine  10 mg Oral Daily  . atorvastatin  80 mg Oral Daily  . bumetanide  1 mg Oral BID  . carvedilol  25 mg Oral BID  . cholecalciferol  5,000 Units Oral Daily  . doxycycline  100 mg Oral BID WC  . epoetin (EPOGEN/PROCRIT) injection  10,000 Units Subcutaneous Q M,W,F  . heparin  5,000 Units Subcutaneous Q8H  . insulin aspart  0-15 Units Subcutaneous TID WC  . insulin aspart  0-5 Units Subcutaneous QHS  . ipratropium-albuterol  3 mL Nebulization TID  . isosorbide mononitrate  60 mg Oral Daily  . predniSONE  50 mg Oral Q breakfast   . sodium chloride flush  3 mL Intravenous Q12H   acetaminophen **OR** acetaminophen, guaiFENesin-dextromethorphan, hydrALAZINE, HYDROcodone-acetaminophen, loperamide, ondansetron **OR** ondansetron (ZOFRAN) IV, polyethylene glycol  Assessment/ Plan:  Jenny [name] is a 63 y.o. black female Jehovah  witness with hypertension, diabetes mellitus type II, congestive heart failure,  CAD/NSTEMI, MGUS, anemia , who was admitted to [facility] on 06/26/2018 for acute congestive heart failure  1. Acute renal failure with hyperkalemia on chronic kidney disease stage IV: with proteinuria.  Baseline creatinine of 2.64, GFR of 21. (Jun 05, 2018) Follows with Nephrologist at [facility].  Chronic kidney disease secondary to diabetes and hypertension.  Acute renal failure secondary to acute cardiorenal syndrome and hypertension urgency.  - holding ACE inhibitor -Serum creatinine remains elevated at 3.42  Continue to monitor -Trial of IV albumin to assist with diuresis  2. Diabetes mellitus type II with chronic kidney disease: glucose controlled.  Hemoglobin A1c of 5.5%.   3.  Generalized edema Third spacing of fluid Trial of IV albumin.  Continue bumetanide.   LOS: 5 Samon Dishner 1/28/202011:35 AM

## 2018-07-01 NOTE — Progress Notes (Signed)
Physical Therapy Treatment Patient Details Name: Jenny [name] MRN: [id number] DOB: 24-Sep-1955 Today's Date: 07/01/2018    History of Present Illness 63 year old female admitted for respiratory failure and SOB with 2L O2 chronically was desatting and up to 4L here.  Has anemia but will not accept a transfusion, also thoracentesis needed but also declined.  Has pleural effusion from CHF.  PMHx:  anemia, CHF, DM, HTN, CKD stage 4,      PT Comments    To edge of bed with mod a x 1.  Sitting x 10 minutes with supervision but able to sit without support.  Participated in exercises as described below.  Attempted to stand +1 assist but unable.  +2 called and she was able to stand x 2 briefly with mod a.  She is only to stand for a few seconds and will quickly sit when she tries to stand fully.  She is unable to take any steps to transfer to recliner and feels a lateral scoot transfer to recliner is too taxing at this time.  O2 sats did remain stable with activity on 2 lpm - mind 90's.   Follow Up Recommendations  SNF     Equipment Recommendations  None recommended by PT    Recommendations for Other Services       Precautions / Restrictions Precautions Precautions: Fall;Other (comment) Precaution Comments: O2 Restrictions Weight Bearing Restrictions: No    Mobility  Bed Mobility Overal bed mobility: Needs Assistance Bed Mobility: Supine to Sit;Sit to Supine     Supine to sit: Mod assist Sit to supine: Mod assist   General bed mobility comments: verbal cues for hand placement and encouragement  Transfers Overall transfer level: Needs assistance Equipment used: Rolling walker (2 wheeled) Transfers: Sit to/from Stand Sit to Stand: Mod assist;+2 physical assistance            Ambulation/Gait             General Gait Details: unable   Stairs             Wheelchair Mobility    Modified Rankin (Stroke Patients Only)       Balance Overall balance  assessment: Needs assistance Sitting-balance support: Feet supported Sitting balance-Leahy Scale: Fair Sitting balance - Comments: able to sit unsupported but requries supervision   Standing balance support: Bilateral upper extremity supported Standing balance-Leahy Scale: Poor Standing balance comment: only able to stand for short periods and quickly returns to sitting, heavy reliance on walker                            Cognition Arousal/Alertness: Awake/alert Behavior During Therapy: WFL for tasks assessed/performed;Flat affect Overall Cognitive Status: Within Functional Limits for tasks assessed                                        Exercises Other Exercises Other Exercises: sitting edge of bed x 10 minutes.  ankle pumps, LAQ and marches x 5 BLE Other Exercises: stood x 2 with walker mod a x 2 for brief periods.    General Comments        Pertinent Vitals/Pain Pain Assessment: No/denies pain    Home Living                      Prior Function  PT Goals (current goals can now be found in the care plan section) Progress towards PT goals: Progressing toward goals    Frequency    Min 2X/week      PT Plan Current plan remains appropriate    Co-evaluation              AM-PAC PT "6 Clicks" Mobility   Outcome Measure  Help needed turning from your back to your side while in a flat bed without using bedrails?: A Little Help needed moving from lying on your back to sitting on the side of a flat bed without using bedrails?: A Lot Help needed moving to and from a bed to a chair (including a wheelchair)?: Total Help needed standing up from a chair using your arms (e.g., wheelchair or bedside chair)?: A Lot Help needed to walk in hospital room?: Total Help needed climbing 3-5 steps with a railing? : Total 6 Click Score: 10    End of Session Equipment Utilized During Treatment: Gait belt;Oxygen   Patient left: in  bed;with call bell/phone within reach;with bed alarm set         Time: 5625-6389 PT Time Calculation (min) (ACUTE ONLY): 20 min  Charges:  $Therapeutic Exercise: 8-22 mins                     Chesley Noon, PTA 07/01/18, 11:31 AM

## 2018-07-03 NOTE — Progress Notes (Deleted)
   Patient ID: Jenny [name], female    DOB: [birth year], 63 y.o.   MRN: [id number]  HPI  Jenny [name] is a 63 y/o female with a history of  Echo report from 05/08/18 reviewed and showed an EF of 40-45% along with grade II diastolic dysfunction and mild/moderate TR.   Admitted 06/26/2018 due to COPD/HF exacerbation. Nephrology consult obtained. IV steroids given and then given prednisone and antibiotics. Has moderate to large pleural effusion but refused thoracentesis. Chronic anemia but patient is Jehovah's witness so will not get blood transfusion. Epogen given. Discharged after 5 days. Admitted 05/29/18 due to acute on chronic heart failure. Palliative care was consulted. Initially needed IV lasix and then transitioned to oral diuretics. Was able to be weaned back down to her 2L of oxygen. Has chronic anemia but is a Jehovah's witness so will not accept blood products. Discharged the following day. Admitted 05/07/18 due to hypothermia, bradycardia and hypotensive. She initially required bear hugger, bipap and multiple doses of atropine. Head CT was negative. Given antibiotics. Aggressively diuresed. Meds were adjusted due to hypotension and carvedilol was held but was able to be resumed. Discharged after 9 days.  She presents today for her initial visit with a chief complaint of   Review of Systems    Physical Exam    Assessment & Plan:  1: Chronic heart failure with mildly reduced EF- - NYHA class - BNP 06/26/2018 was 3002.0  2: HTN- - BP - BMP 07/01/2018 reviewed and showed sodium 138, potassium 4.7, creatinine 3.42 and GFR 16  3: DM- - A1c 05/29/18 was 5.5%  4: Anemia- - CBC 06/28/2018 reviewed and showed hemoglobin of 7.2 - patient is jehovah's witness so does not receive blood products

## 2018-07-03 NOTE — Progress Notes (Signed)
07/03/18: EMS misplaced medical necessity form. Clinical Education officer, museum (CSW) re-printed form for EMS today.   [employer], LCSW [phone]

## 2018-07-04 ENCOUNTER — Telehealth: Payer: Self-pay | Admitting: Family

## 2018-07-04 ENCOUNTER — Ambulatory Visit: Payer: BC Managed Care – PPO | Admitting: Family

## 2018-07-04 NOTE — Telephone Encounter (Signed)
Patient did not show for her Heart Failure Clinic appointment on 07/04/2018. Will attempt to reschedule.

## 2018-07-17 ENCOUNTER — Emergency Department: Payer: BC Managed Care – PPO

## 2018-07-17 ENCOUNTER — Observation Stay: Payer: BC Managed Care – PPO

## 2018-07-17 ENCOUNTER — Inpatient Hospital Stay
Admission: EM | Admit: 2018-07-17 | Discharge: 2018-07-28 | DRG: 291 | Disposition: A | Discharge status: Home Health | Payer: BC Managed Care – PPO | Source: Skilled Nursing Facility | Attending: Family Medicine | Admitting: Family Medicine

## 2018-07-17 DIAGNOSIS — J441 Chronic obstructive pulmonary disease with (acute) exacerbation: Secondary | ICD-10-CM | POA: Diagnosis present

## 2018-07-17 DIAGNOSIS — I5043 Acute on chronic combined systolic (congestive) and diastolic (congestive) heart failure: Secondary | ICD-10-CM | POA: Diagnosis not present

## 2018-07-17 DIAGNOSIS — R627 Adult failure to thrive: Secondary | ICD-10-CM | POA: Diagnosis present

## 2018-07-17 DIAGNOSIS — Z7189 Other specified counseling: Secondary | ICD-10-CM

## 2018-07-17 DIAGNOSIS — L89151 Pressure ulcer of sacral region, stage 1: Secondary | ICD-10-CM | POA: Diagnosis present

## 2018-07-17 DIAGNOSIS — L89611 Pressure ulcer of right heel, stage 1: Secondary | ICD-10-CM | POA: Diagnosis present

## 2018-07-17 DIAGNOSIS — Z9981 Dependence on supplemental oxygen: Secondary | ICD-10-CM

## 2018-07-17 DIAGNOSIS — R0602 Shortness of breath: Secondary | ICD-10-CM

## 2018-07-17 DIAGNOSIS — E1122 Type 2 diabetes mellitus with diabetic chronic kidney disease: Secondary | ICD-10-CM | POA: Diagnosis present

## 2018-07-17 DIAGNOSIS — D509 Iron deficiency anemia, unspecified: Secondary | ICD-10-CM | POA: Diagnosis present

## 2018-07-17 DIAGNOSIS — Z9889 Other specified postprocedural states: Secondary | ICD-10-CM | POA: Diagnosis not present

## 2018-07-17 DIAGNOSIS — E8809 Other disorders of plasma-protein metabolism, not elsewhere classified: Secondary | ICD-10-CM | POA: Diagnosis present

## 2018-07-17 DIAGNOSIS — I130 Hypertensive heart and chronic kidney disease with heart failure and stage 1 through stage 4 chronic kidney disease, or unspecified chronic kidney disease: Secondary | ICD-10-CM | POA: Diagnosis not present

## 2018-07-17 DIAGNOSIS — Z87891 Personal history of nicotine dependence: Secondary | ICD-10-CM

## 2018-07-17 DIAGNOSIS — R680 Hypothermia, not associated with low environmental temperature: Secondary | ICD-10-CM | POA: Diagnosis not present

## 2018-07-17 DIAGNOSIS — N390 Urinary tract infection, site not specified: Secondary | ICD-10-CM | POA: Diagnosis present

## 2018-07-17 DIAGNOSIS — I509 Heart failure, unspecified: Secondary | ICD-10-CM

## 2018-07-17 DIAGNOSIS — N179 Acute kidney failure, unspecified: Secondary | ICD-10-CM

## 2018-07-17 DIAGNOSIS — J189 Pneumonia, unspecified organism: Secondary | ICD-10-CM

## 2018-07-17 DIAGNOSIS — M549 Dorsalgia, unspecified: Secondary | ICD-10-CM | POA: Diagnosis not present

## 2018-07-17 DIAGNOSIS — D631 Anemia in chronic kidney disease: Secondary | ICD-10-CM | POA: Diagnosis present

## 2018-07-17 DIAGNOSIS — Z5329 Procedure and treatment not carried out because of patient's decision for other reasons: Secondary | ICD-10-CM | POA: Diagnosis present

## 2018-07-17 DIAGNOSIS — E440 Moderate protein-calorie malnutrition: Secondary | ICD-10-CM | POA: Diagnosis present

## 2018-07-17 DIAGNOSIS — L89621 Pressure ulcer of left heel, stage 1: Secondary | ICD-10-CM | POA: Diagnosis present

## 2018-07-17 DIAGNOSIS — N184 Chronic kidney disease, stage 4 (severe): Secondary | ICD-10-CM | POA: Diagnosis present

## 2018-07-17 DIAGNOSIS — J9601 Acute respiratory failure with hypoxia: Secondary | ICD-10-CM

## 2018-07-17 DIAGNOSIS — J9690 Respiratory failure, unspecified, unspecified whether with hypoxia or hypercapnia: Secondary | ICD-10-CM | POA: Diagnosis present

## 2018-07-17 DIAGNOSIS — Z8249 Family history of ischemic heart disease and other diseases of the circulatory system: Secondary | ICD-10-CM

## 2018-07-17 DIAGNOSIS — J90 Pleural effusion, not elsewhere classified: Secondary | ICD-10-CM

## 2018-07-17 DIAGNOSIS — Z880 Allergy status to penicillin: Secondary | ICD-10-CM

## 2018-07-17 DIAGNOSIS — Z79899 Other long term (current) drug therapy: Secondary | ICD-10-CM

## 2018-07-17 DIAGNOSIS — J440 Chronic obstructive pulmonary disease with acute lower respiratory infection: Secondary | ICD-10-CM

## 2018-07-17 DIAGNOSIS — J9621 Acute and chronic respiratory failure with hypoxia: Secondary | ICD-10-CM | POA: Diagnosis present

## 2018-07-17 DIAGNOSIS — Z515 Encounter for palliative care: Secondary | ICD-10-CM | POA: Diagnosis not present

## 2018-07-17 DIAGNOSIS — Z5320 Procedure and treatment not carried out because of patient's decision for unspecified reasons: Secondary | ICD-10-CM

## 2018-07-17 DIAGNOSIS — R0600 Dyspnea, unspecified: Secondary | ICD-10-CM

## 2018-07-17 DIAGNOSIS — R64 Cachexia: Secondary | ICD-10-CM | POA: Diagnosis present

## 2018-07-17 LAB — CBC WITH DIFFERENTIAL/PLATELET
Abs Immature Granulocytes: 0.08 10*3/uL — ABNORMAL HIGH (ref 0.00–0.07)
BASOS ABS: 0 10*3/uL (ref 0.0–0.1)
Basophils Relative: 0 %
Eosinophils Absolute: 0.1 10*3/uL (ref 0.0–0.5)
Eosinophils Relative: 1 %
HEMATOCRIT: 26.5 % — AB (ref 36.0–46.0)
Hemoglobin: 7.7 g/dL — ABNORMAL LOW (ref 12.0–15.0)
Immature Granulocytes: 1 %
Lymphocytes Relative: 10 %
Lymphs Abs: 0.9 10*3/uL (ref 0.7–4.0)
MCH: 28.3 pg (ref 26.0–34.0)
MCHC: 29.1 g/dL — ABNORMAL LOW (ref 30.0–36.0)
MCV: 97.4 fL (ref 80.0–100.0)
Monocytes Absolute: 0.3 10*3/uL (ref 0.1–1.0)
Monocytes Relative: 3 %
NRBC: 0 % (ref 0.0–0.2)
Neutro Abs: 7.6 10*3/uL (ref 1.7–7.7)
Neutrophils Relative %: 85 %
Platelets: 176 10*3/uL (ref 150–400)
RBC: 2.72 MIL/uL — ABNORMAL LOW (ref 3.87–5.11)
RDW: 19.8 % — ABNORMAL HIGH (ref 11.5–15.5)
WBC: 8.9 10*3/uL (ref 4.0–10.5)

## 2018-07-17 LAB — BASIC METABOLIC PANEL
Anion gap: 7 (ref 5–15)
BUN: 103 mg/dL — ABNORMAL HIGH (ref 8–23)
CO2: 21 mmol/L — ABNORMAL LOW (ref 22–32)
Calcium: 6.3 mg/dL — CL (ref 8.9–10.3)
Chloride: 111 mmol/L (ref 98–111)
Creatinine, Ser: 3.49 mg/dL — ABNORMAL HIGH (ref 0.44–1.00)
GFR calc non Af Amer: 13 mL/min — ABNORMAL LOW (ref >60)
GFR, EST AFRICAN AMERICAN: 15 mL/min — AB (ref >60)
Glucose, Bld: 246 mg/dL — ABNORMAL HIGH (ref 70–99)
Potassium: 4.5 mmol/L (ref 3.5–5.1)
Sodium: 139 mmol/L (ref 135–145)

## 2018-07-17 LAB — TROPONIN I
Troponin I: 0.03 ng/mL (ref <0.03)
Troponin I: <0.03 ng/mL (ref <0.03)
Troponin I: 0.04 ng/mL (ref <0.03)

## 2018-07-17 LAB — GLUCOSE, CAPILLARY
Glucose-Capillary: 346 mg/dL — ABNORMAL HIGH (ref 70–99)
Glucose-Capillary: 387 mg/dL — ABNORMAL HIGH (ref 70–99)

## 2018-07-17 LAB — BRAIN NATRIURETIC PEPTIDE: B Natriuretic Peptide: 1386 pg/mL — ABNORMAL HIGH (ref 0.0–100.0)

## 2018-07-17 MED ORDER — VITAMIN D3 25 MCG (1000 UNIT) PO TABS
5000.0000 [IU] | ORAL_TABLET | Freq: Every day | ORAL | Status: DC
  Administered 2018-07-17 – 2018-07-28 (×12): 5000 [IU] via ORAL
  Filled 2018-07-17 (×12): qty 5

## 2018-07-17 MED ORDER — SILVER SULFADIAZINE 1 % EX CREA
1.0000 "application " | TOPICAL_CREAM | Freq: Three times a day (TID) | CUTANEOUS | Status: DC
  Administered 2018-07-17 – 2018-07-27 (×28): 1 via TOPICAL
  Filled 2018-07-17: qty 85

## 2018-07-17 MED ORDER — SODIUM CHLORIDE 0.9% FLUSH
3.0000 mL | Freq: Two times a day (BID) | INTRAVENOUS | Status: DC
  Administered 2018-07-17 – 2018-07-28 (×19): 3 mL via INTRAVENOUS

## 2018-07-17 MED ORDER — VITAMIN C 500 MG PO TABS
500.0000 mg | ORAL_TABLET | Freq: Two times a day (BID) | ORAL | Status: DC
  Administered 2018-07-17 – 2018-07-28 (×23): 500 mg via ORAL
  Filled 2018-07-17 (×24): qty 1

## 2018-07-17 MED ORDER — AMLODIPINE BESYLATE 5 MG PO TABS
2.5000 mg | ORAL_TABLET | Freq: Every day | ORAL | Status: DC
  Administered 2018-07-17 – 2018-07-28 (×12): 2.5 mg via ORAL
  Filled 2018-07-17 (×12): qty 1

## 2018-07-17 MED ORDER — SODIUM CHLORIDE 0.9 % IV SOLN
1.0000 g | Freq: Once | INTRAVENOUS | Status: AC
  Administered 2018-07-17: 1 g via INTRAVENOUS
  Filled 2018-07-17: qty 10

## 2018-07-17 MED ORDER — INSULIN ASPART 100 UNIT/ML ~~LOC~~ SOLN
0.0000 [IU] | Freq: Every day | SUBCUTANEOUS | Status: DC
  Administered 2018-07-17: 5 [IU] via SUBCUTANEOUS
  Administered 2018-07-18 – 2018-07-24 (×2): 2 [IU] via SUBCUTANEOUS
  Administered 2018-07-26: 3 [IU] via SUBCUTANEOUS
  Administered 2018-07-27: 4 [IU] via SUBCUTANEOUS
  Filled 2018-07-17 (×4): qty 1

## 2018-07-17 MED ORDER — EPOETIN ALFA-EPBX 10000 UNIT/ML IJ SOLN
15000.0000 [IU] | INTRAMUSCULAR | Status: DC
  Filled 2018-07-17: qty 2

## 2018-07-17 MED ORDER — ALPRAZOLAM 0.25 MG PO TABS
0.2500 mg | ORAL_TABLET | Freq: Two times a day (BID) | ORAL | Status: DC | PRN
  Administered 2018-07-18 – 2018-07-26 (×3): 0.25 mg via ORAL
  Filled 2018-07-17 (×3): qty 1

## 2018-07-17 MED ORDER — HEPARIN SODIUM (PORCINE) 5000 UNIT/ML IJ SOLN
5000.0000 [IU] | Freq: Three times a day (TID) | INTRAMUSCULAR | Status: DC
  Administered 2018-07-17 – 2018-07-28 (×33): 5000 [IU] via SUBCUTANEOUS
  Filled 2018-07-17 (×31): qty 1

## 2018-07-17 MED ORDER — ISOSORBIDE MONONITRATE ER 60 MG PO TB24
60.0000 mg | ORAL_TABLET | Freq: Every day | ORAL | Status: DC
  Administered 2018-07-17 – 2018-07-28 (×12): 60 mg via ORAL
  Filled 2018-07-17 (×12): qty 1

## 2018-07-17 MED ORDER — METOLAZONE 2.5 MG PO TABS
2.5000 mg | ORAL_TABLET | Freq: Every day | ORAL | Status: DC
  Administered 2018-07-17 – 2018-07-22 (×6): 2.5 mg via ORAL
  Filled 2018-07-17 (×6): qty 1

## 2018-07-17 MED ORDER — POLYETHYLENE GLYCOL 3350 17 G PO PACK: 17.0000 g | PACK | Freq: Every day | ORAL | Status: DC | PRN

## 2018-07-17 MED ORDER — SODIUM CHLORIDE 0.9% FLUSH: 3.0000 mL | INTRAVENOUS | Status: DC | PRN

## 2018-07-17 MED ORDER — CARVEDILOL 25 MG PO TABS
25.0000 mg | ORAL_TABLET | Freq: Two times a day (BID) | ORAL | Status: DC
  Administered 2018-07-17 – 2018-07-24 (×16): 25 mg via ORAL
  Filled 2018-07-17 (×16): qty 1

## 2018-07-17 MED ORDER — SODIUM CHLORIDE 0.9 % IV SOLN
100.0000 mg | Freq: Two times a day (BID) | INTRAVENOUS | Status: DC
  Administered 2018-07-17 – 2018-07-20 (×7): 100 mg via INTRAVENOUS
  Filled 2018-07-17 (×10): qty 100

## 2018-07-17 MED ORDER — METHYLPREDNISOLONE SODIUM SUCC 125 MG IJ SOLR
125.0000 mg | Freq: Once | INTRAMUSCULAR | Status: AC
  Administered 2018-07-17: 125 mg via INTRAVENOUS
  Filled 2018-07-17: qty 2

## 2018-07-17 MED ORDER — ACETAMINOPHEN 325 MG PO TABS: 650.0000 mg | ORAL_TABLET | Freq: Four times a day (QID) | ORAL | Status: DC | PRN

## 2018-07-17 MED ORDER — METHYLPREDNISOLONE SODIUM SUCC 125 MG IJ SOLR
60.0000 mg | Freq: Four times a day (QID) | INTRAMUSCULAR | Status: DC
  Administered 2018-07-17 – 2018-07-21 (×14): 60 mg via INTRAVENOUS
  Filled 2018-07-17 (×14): qty 2

## 2018-07-17 MED ORDER — IPRATROPIUM-ALBUTEROL 0.5-2.5 (3) MG/3ML IN SOLN
3.0000 mL | Freq: Four times a day (QID) | RESPIRATORY_TRACT | Status: DC
  Administered 2018-07-17 – 2018-07-19 (×8): 3 mL via RESPIRATORY_TRACT
  Filled 2018-07-17 (×8): qty 3

## 2018-07-17 MED ORDER — SODIUM CHLORIDE 0.9 % IV SOLN
250.0000 mL | INTRAVENOUS | Status: DC | PRN
  Administered 2018-07-18 – 2018-07-19 (×3): 250 mL via INTRAVENOUS

## 2018-07-17 MED ORDER — ACETAMINOPHEN 325 MG PO TABS
650.0000 mg | ORAL_TABLET | ORAL | Status: DC | PRN
  Administered 2018-07-19 – 2018-07-23 (×2): 650 mg via ORAL
  Filled 2018-07-17 (×2): qty 2

## 2018-07-17 MED ORDER — ATORVASTATIN CALCIUM 20 MG PO TABS
80.0000 mg | ORAL_TABLET | Freq: Every day | ORAL | Status: DC
  Administered 2018-07-17 – 2018-07-28 (×12): 80 mg via ORAL
  Filled 2018-07-17 (×12): qty 4

## 2018-07-17 MED ORDER — INSULIN ASPART 100 UNIT/ML ~~LOC~~ SOLN
0.0000 [IU] | Freq: Three times a day (TID) | SUBCUTANEOUS | Status: DC
  Administered 2018-07-17: 15 [IU] via SUBCUTANEOUS
  Administered 2018-07-18: 7 [IU] via SUBCUTANEOUS
  Administered 2018-07-18 (×2): 11 [IU] via SUBCUTANEOUS
  Administered 2018-07-19: 7 [IU] via SUBCUTANEOUS
  Administered 2018-07-19 (×2): 4 [IU] via SUBCUTANEOUS
  Administered 2018-07-20: 3 [IU] via SUBCUTANEOUS
  Administered 2018-07-24 – 2018-07-25 (×2): 4 [IU] via SUBCUTANEOUS
  Administered 2018-07-25 (×2): 3 [IU] via SUBCUTANEOUS
  Administered 2018-07-26 (×3): 7 [IU] via SUBCUTANEOUS
  Administered 2018-07-27: 11 [IU] via SUBCUTANEOUS
  Administered 2018-07-27: 7 [IU] via SUBCUTANEOUS
  Administered 2018-07-28: 4 [IU] via SUBCUTANEOUS
  Filled 2018-07-17 (×19): qty 1

## 2018-07-17 MED ORDER — FUROSEMIDE 10 MG/ML IJ SOLN
80.0000 mg | Freq: Two times a day (BID) | INTRAMUSCULAR | Status: DC
  Administered 2018-07-17 – 2018-07-20 (×6): 80 mg via INTRAVENOUS
  Filled 2018-07-17 (×6): qty 8

## 2018-07-17 MED ORDER — GUAIFENESIN ER 600 MG PO TB12
600.0000 mg | ORAL_TABLET | Freq: Two times a day (BID) | ORAL | Status: DC
  Administered 2018-07-17 – 2018-07-28 (×23): 600 mg via ORAL
  Filled 2018-07-17 (×23): qty 1

## 2018-07-17 MED ORDER — LOPERAMIDE HCL 2 MG PO CAPS
2.0000 mg | ORAL_CAPSULE | Freq: Four times a day (QID) | ORAL | Status: DC | PRN
  Administered 2018-07-21 – 2018-07-27 (×7): 2 mg via ORAL
  Filled 2018-07-17 (×7): qty 1

## 2018-07-17 MED ORDER — OXYCODONE-ACETAMINOPHEN 5-325 MG PO TABS: 1.0000 | ORAL_TABLET | Freq: Four times a day (QID) | ORAL | Status: DC | PRN

## 2018-07-17 MED ORDER — ONDANSETRON HCL 4 MG/2ML IJ SOLN: 4.0000 mg | Freq: Four times a day (QID) | INTRAMUSCULAR | Status: DC | PRN

## 2018-07-17 MED ORDER — ADULT MULTIVITAMIN W/MINERALS CH
1.0000 | ORAL_TABLET | Freq: Every day | ORAL | Status: DC
  Administered 2018-07-17 – 2018-07-28 (×12): 1 via ORAL
  Filled 2018-07-17 (×12): qty 1

## 2018-07-17 MED ORDER — HEPARIN SODIUM (PORCINE) 5000 UNIT/ML IJ SOLN
INTRAMUSCULAR | Status: AC
  Filled 2018-07-17: qty 1

## 2018-07-17 MED ORDER — DM-GUAIFENESIN ER 30-600 MG PO TB12: 1.0000 | ORAL_TABLET | Freq: Two times a day (BID) | ORAL | Status: DC | PRN

## 2018-07-17 MED ORDER — FUROSEMIDE 10 MG/ML IJ SOLN
40.0000 mg | Freq: Once | INTRAMUSCULAR | Status: AC
  Administered 2018-07-17: 40 mg via INTRAVENOUS
  Filled 2018-07-17: qty 4

## 2018-07-17 MED ORDER — PRO-STAT SUGAR FREE PO LIQD
30.0000 mL | Freq: Two times a day (BID) | ORAL | Status: DC
  Administered 2018-07-17 – 2018-07-22 (×7): 30 mL via ORAL

## 2018-07-17 NOTE — Progress Notes (Signed)
I was told that the patient initially refused to do her thoracentesis, so I asked the patient if she is willing to do the procedure and she agree. But when I was reading the note, it seems like radiology actually did the procedure, I called radiology department to confirm and they did say that her thoracentesis was done. I called the hospitalist and patient's son and updated them about this. [name] Jenny [name] wants [place] to call him first if there are any plans for procedure. RN will continue to monitor.

## 2018-07-17 NOTE — H&P (Addendum)
[facility] [place] NAME: Jenny [name]    MR#:  [id number]  DATE OF BIRTH:  11/28/1955  DATE OF ADMISSION:  07/17/2018  PRIMARY CARE PHYSICIAN: Juluis Pitch, MD   REQUESTING/REFERRING PHYSICIAN:   CHIEF COMPLAINT:  No chief complaint on file.   HISTORY OF PRESENT ILLNESS: Jenny [name]  is a 63 y.o. female with a known history per below, presenting from local nursing home with acute shortness of breath, on 2 L chronically, recent hospital discharge approximately 2 weeks ago for respiratory failure due to COPD/heart failure/chronic left pleural effusion-patient refused thoracentesis at that time, brought to the emergency room via EMS for recurrent shortness of breath, noted O2 saturation into the 80s, had been short of breath for the last 24 hours, dyspnea with exertion/activity, noted worsening swelling in the legs, ER work-up noted for BNP greater than 1300, hemoglobin 7.7-patient is Jehovah's Witness, chest x-ray noted for edema/persistent left moderate-sized pleural effusion, patient evaluated in the emergency room, no apparent distress, resting comfortably in bed, patient is now been admitted for acute on chronic hypoxic respiratory failure suspected secondary to acute on chronic systolic congestive heart failure, acute on chronic left-sided moderate pleural effusion, and COPD.  PAST MEDICAL HISTORY:   Past Medical History:  Diagnosis Date  . Anemia   . CHF (congestive heart failure) ([place])   . CKD (chronic kidney disease) stage 4, GFR 15-29 ml/min (HCC)   . Diabetes mellitus without complication ([place])   . Hypertension   . Patient is Jehovah's Witness     PAST SURGICAL HISTORY:  None  SOCIAL HISTORY:  Social History   Tobacco Use  . Smoking status: Former Research scientist (life sciences)  . Smokeless tobacco: Never Used  Substance Use Topics  . Alcohol use: Not Currently    FAMILY HISTORY:  Hypertension  DRUG ALLERGIES:  Allergies  Allergen  Reactions  . Penicillins Rash    REVIEW OF SYSTEMS:   CONSTITUTIONAL: No fever,+ fatigue, weakness.  EYES: No blurred or double vision.  EARS, NOSE, AND THROAT: No tinnitus or ear pain.  RESPIRATORY: +cough, shortness of breath, wheezing  CARDIOVASCULAR: No chest pain, + orthopnea, edema.  GASTROINTESTINAL: No nausea, vomiting, diarrhea or abdominal pain.  GENITOURINARY: No dysuria, hematuria.  ENDOCRINE: No polyuria, nocturia,  HEMATOLOGY: No anemia, easy bruising or bleeding SKIN: No rash or lesion. MUSCULOSKELETAL: No joint pain or arthritis.   NEUROLOGIC: No tingling, numbness, weakness.  PSYCHIATRY: No anxiety or depression.   MEDICATIONS AT HOME:  Prior to Admission medications   Medication Sig Start Date End Date Taking? Authorizing Provider  acetaminophen (TYLENOL) 325 MG tablet Take 2 tablets (650 mg total) by mouth every 6 (six) hours as needed for mild pain (or Fever >/= 101). 07/01/18  Yes Gladstone Lighter, MD  Amino Acids-Protein Hydrolys (FEEDING SUPPLEMENT, PRO-STAT SUGAR FREE 64,) LIQD Take 30 mLs by mouth 2 (two) times daily.   Yes [provider]  amLODipine (NORVASC) 10 MG tablet Take 1 tablet (10 mg total) by mouth daily. 07/02/18  Yes Gladstone Lighter, MD  ascorbic acid (VITAMIN C) 250 MG tablet Take 500 mg by mouth 2 (two) times daily.   Yes [provider]  atorvastatin (LIPITOR) 80 MG tablet Take 80 mg by mouth daily.   Yes [provider]  bumetanide (BUMEX) 1 MG tablet Take 1 tablet (1 mg total) by mouth 2 (two) times daily. 07/01/18  Yes Gladstone Lighter, MD  carvedilol (COREG) 25 MG tablet Take 1 tablet by  mouth 2 (two) times daily. 05/16/18 07/17/18 Yes [provider]  Cholecalciferol (VITAMIN D3) 125 MCG (5000 UT) CAPS Take 5,000 Units by mouth daily.    Yes [provider]  dextromethorphan-guaiFENesin (MUCINEX DM) 30-600 MG 12hr tablet Take 1 tablet by mouth 2 (two) times daily as needed for cough.   Yes  [provider]  epoetin alfa-epbx (RETACRIT) 54008 UNIT/ML injection 15,000 Units every Friday.   Yes [provider]  ipratropium-albuterol (DUONEB) 0.5-2.5 (3) MG/3ML SOLN Take 3 mLs by nebulization every 6 (six) hours as needed (wheezing, shortness of breath). Patient taking differently: Take 3 mLs by nebulization 4 (four) times daily.  07/01/18  Yes Gladstone Lighter, MD  isosorbide mononitrate (IMDUR) 60 MG 24 hr tablet Take 1 tablet (60 mg total) by mouth daily. 05/30/18  Yes Bettey Costa, MD  loperamide (IMODIUM) 2 MG capsule Take 2 mg by mouth every 6 (six) hours as needed for diarrhea or loose stools.   Yes [provider]  metolazone (ZAROXOLYN) 2.5 MG tablet Take 2.5 mg by mouth daily.   Yes [provider]  Multiple Vitamin (MULTIVITAMIN) tablet Take 1 tablet by mouth daily.   Yes [provider]  polyethylene glycol (MIRALAX / GLYCOLAX) packet Take 17 g by mouth daily as needed for mild constipation. 07/01/18  Yes Gladstone Lighter, MD  silver sulfADIAZINE (SILVADENE) 1 % cream Apply 1 application topically 3 (three) times daily. Apply to sacrum and bilateral buttocks.   Yes [provider]      PHYSICAL EXAMINATION:   VITAL SIGNS: Blood pressure 122/62, pulse 60, resp. rate 18, height 5\' 5"  (1.651 m), weight 59 kg, SpO2 93 %.  GENERAL:  63 y.o.-year-old patient lying in the bed with no acute distress.  Malnourished appearing EYES: Pupils equal, round, reactive to light and accommodation. No scleral icterus. Extraocular muscles intact.  HEENT: Head atraumatic, normocephalic. Oropharynx and nasopharynx clear.  NECK:  Supple, no jugular venous distention. No thyroid enlargement, no tenderness.  LUNGS: Diminished breath sounds with rhonchi/wheezing bilaterally.  Mild use of accessory muscles of respiration.  CARDIOVASCULAR: S1, S2 normal. No murmurs, rubs, or gallops.  ABDOMEN: Soft, nontender, nondistended. Bowel sounds present.  No organomegaly or mass.  EXTREMITIES: + Bilateral lower extremity pitting edema, no cyanosis, or clubbing.  NEUROLOGIC: Cranial nerves II through XII are intact. MAES. Gait not checked.  PSYCHIATRIC: The patient is alert and oriented x 3.  SKIN: No obvious rash, lesion, or ulcer.   LABORATORY PANEL:   CBC Recent Labs  Lab 07/17/18 0320  WBC 8.9  HGB 7.7*  HCT 26.5*  PLT 176  MCV 97.4  MCH 28.3  MCHC 29.1*  RDW 19.8*  LYMPHSABS 0.9  MONOABS 0.3  EOSABS 0.1  BASOSABS 0.0   ------------------------------------------------------------------------------------------------------------------  Chemistries  Recent Labs  Lab 07/17/18 0320  NA 139  K 4.5  CL 111  CO2 21*  GLUCOSE 246*  BUN 103*  CREATININE 3.49*  CALCIUM 6.3*   ------------------------------------------------------------------------------------------------------------------ estimated creatinine clearance is 14.8 mL/min (A) (by C-G formula based on SCr of 3.49 mg/dL (H)). ------------------------------------------------------------------------------------------------------------------ No results for input(s): TSH, T4TOTAL, T3FREE, THYROIDAB in the last 72 hours.  Invalid input(s): FREET3   Coagulation profile No results for input(s): INR, PROTIME in the last 168 hours. ------------------------------------------------------------------------------------------------------------------- No results for input(s): DDIMER in the last 72 hours. -------------------------------------------------------------------------------------------------------------------  Cardiac Enzymes No results for input(s): CKMB, TROPONINI, MYOGLOBIN in the last 168 hours.  Invalid input(s): CK ------------------------------------------------------------------------------------------------------------------ Invalid input(s):  POCBNP  ---------------------------------------------------------------------------------------------------------------  Urinalysis    Component Value Date/Time   COLORURINE YELLOW (A) 06/27/2018 0954   APPEARANCEUR HAZY (A) 06/27/2018 0954   LABSPEC 1.014 06/27/2018 0954   PHURINE 5.0 06/27/2018 0954   GLUCOSEU >=500 (A) 06/27/2018 0954   HGBUR SMALL (A) 06/27/2018 0954   BILIRUBINUR NEGATIVE 06/27/2018 [place] 06/27/2018 0954   PROTEINUR 100 (A) 06/27/2018 0954   NITRITE NEGATIVE 06/27/2018 0954   LEUKOCYTESUR NEGATIVE 06/27/2018 0954     RADIOLOGY: Dg Chest Port 1 View  Result Date: 07/17/2018 CLINICAL DATA:  Difficulty breathing EXAM: PORTABLE CHEST 1 VIEW COMPARISON:  06/28/2018 FINDINGS: Cardiomegaly with vascular congestion. Moderate left pleural effusion with left lower lobe atelectasis. Mild interstitial prominence likely reflects interstitial edema, similar to prior study. IMPRESSION: Cardiomegaly with vascular congestion and mild interstitial edema. Moderate left effusion with left base atelectasis. Electronically Signed   By: Rolm Baptise M.D.   On: 07/17/2018 03:31    EKG: Orders placed or performed during the hospital encounter of 07/17/18  . ED EKG  . ED EKG    IMPRESSION AND PLAN: 63 year old female past medical history significant for chronic respiratory failure secondary to COPD, CHF on home oxygen, CKD stage IV, diabetes, hypertension, frequent hospitalizations for similar presentation, presents to hospital from [facility] secondary to recurrent respiratory failure  *Acute on chronic hypoxic respiratory failure secondary to multifactorial process that includes acute on chronic systolic congestive heart failure/COPD exacerbation, acute on chronic left pleural effusion, and chronic severe anemia of chronic disease  Noted frequent hospitalizations for same, discharged approximately 2 weeks ago-had refused thoracentesis at that time,  Jehovah's Witness-refuses blood transfusions Refer to the observation unit, supplemental oxygen wean as tolerated-on 2 L chronically at the nursing home  *Acute on chronic systolic CHF exacerbation most recent echocardiogram with EF of 40 to 79% and diastolic dysfunction Congestive heart failure protocol, strict I&O monitoring, daily weights, IV Lasix twice daily, Zaroxolyn,   *Acute on COPD exacerbation  IV Solu-Medrol with tapering as tolerated, aggressive pulmonary toilet with bronchodilator therapy, mucolytic agents, empiric doxycycline, respiratory therapy to see   *Acute on chronic left pleural effusion  Moderate to large in size  Consult IR for thoracentesis-patient states that she is now willing to have this done  N.p.o. for now  * CKD stage IV At baseline Avoid nephrotoxic agents, strict I&O monitoring, daily weights Patient follows with [facility] nephrology Most recent renal ultrasound with increased echotexture of bilateral kidneys  *Chronic adult failure to thrive  Increase nursing care PRN, aspiration/fall/skin care precautions while in house, palliative care consulted given long-term poor prognosis   *Chronic microcytic anemi patient is a Jehovah's Witness Hemoglobin is stable and at 7 Epogen weekly will be continued   *Chronic hypertension  Stable  Continue Imdur, Coreg and Norvasc  Long-term prognosis is poor-palliative care/chaplain services consulted   All the records are reviewed and case discussed with ED provider. Management plans discussed with the patient, family and they are in agreement.  CODE STATUS:full Code Status History    Date Active Date Inactive Code Status Order ID Comments User Context   06/26/2018 2215 07/01/2018 2210 Full Code [id number]  Gorden Harms, MD Inpatient   05/29/2018 2023 05/30/2018 2034 Full Code [id number]  Hillary Bow, MD ED       TOTAL TIME TAKING CARE OF THIS PATIENT: 45 minutes.    Avel Peace Shakiera Edelson M.D on  07/17/2018   Between 7am to 6pm - Pager - [phone]  After 6pm go to  [email or web address] - password [email or web address] [facility]  Office  [phone]  CC: Primary care physician; Juluis Pitch, MD   Note: This dictation was prepared with Dragon dictation along with smaller phrase technology. Any transcriptional errors that result from this process are unintentional.

## 2018-07-17 NOTE — ED Triage Notes (Signed)
Pt arrived from [facility] via EMS with complaints of difficulty breathing that started today. Facility gave pt 5 Albuterol treatments. Pt is normally on 2L at facility. EMS reported that wheezing was heard in all fields. Pt has Hx of COPD and Heart Failure. EMS gave 1 Albuterol Tx en route. EMS states VS WNL BP-135/72 HR-70s. Upon inspection pt legs have edema bilaterally. Dr. Owens Shark at bedside.

## 2018-07-17 NOTE — ED Notes (Signed)
Lab called with a critical low Calcium of 6.3 Dr. Owens Shark was notified.

## 2018-07-17 NOTE — ED Notes (Signed)
Informed pt she is NPO until after her procedure and pt states "I have not consented to any procedure " when asked if she is going to consent pt states "I am not consenting ". hospitalist paged.

## 2018-07-17 NOTE — Progress Notes (Signed)
Family Meeting Note  Advance Directive:yes  Today a meeting took place with the Patient.  Patient is able to participate   The following clinical team members were present during this meeting:MD  The following were discussed:Patient's diagnosis: respiratory failure, Patient's progosis: Unable to determine and Goals for treatment: Full Code  Additional follow-up to be provided: prn  Time spent during discussion:20 minutes  Gorden Harms, MD

## 2018-07-17 NOTE — Progress Notes (Signed)
Ch visited pt regarding an OR. Pt was in ER still awaiting a bed in 2A according to pt's nurse. Ch assisted w/ pt's comfort and shared that it was nice to see the pt again. Pt was just recently hospitalized last month. Pt shared that she had trouble breathing at home. Pt had a mild affect but was still able to converse w/ ch. Ch read Psalm 34 w/ pt and prayed silently and aloud. Ch observed that the pt presented to hv declined in weight. Nurse of pt share w/ ch that pt was willing to move forward w/ the procedure that the pt initially declined during her Jan. admission. F/u will be needed to assist pt w/ AD.    07/17/18 0900  Clinical Encounter Type  Visited With Patient  Visit Type Psychological support;Spiritual support;Social support  Referral From Physician  Consult/Referral To [place];Sacred text  Stress Factors  Patient Stress Factors Exhausted;Health changes;Major life changes  Family Stress Factors None identified  Advance Directives (For Healthcare)  Does Patient Have a Medical Advance Directive? No  Would patient like information on creating a medical advance directive? Yes (Inpatient - patient requests chaplain consult to create a medical advance directive) (Pt request a consult but w/ family member present. f/u )

## 2018-07-17 NOTE — Consult Note (Signed)
Consultation Note Date: 07/17/2018   Patient Name: Jenny [name]  DOB: 07-24-1955  MRN: [id number]  Age / Sex: 63 y.o., female  PCP: Juluis Pitch, MD Referring Physician: Otila Back, MD  Reason for Consultation: Establishing goals of care and Psychosocial/spiritual support  HPI/Patient Profile: 63 y.o. female  with past medical history of heart failure, stage IV kidney disease, diabetes, hypertension, anemia complicated by [facility] Witness, will not accept blood products admitted on 07/17/2018 with acute on chronic respiratory failure/ acute on chronic systolic CHF. PMT consulted dt poor long term prognosis.   Clinical Assessment and Goals of Care: Jenny [name] is lying quietly in bed.  She will make an briefly keep eye contact.  She appears weak and frail, older than stated age.  There is no family at bedside at this time.  Jenny [name] and I talked about her thoracentesis.  I ask if it was uncomfortable or painful and she tells me, "it was not so bad".  I give her some soda which she is able to take without any signs and symptoms of aspiration.  I attempt to help her become comfortable in the bed, she appears quite tired.  I share that I will come back tomorrow and we will have a visit.  PMT to follow-up 2/14.  HCPOA    NEXT OF KIN - Jenny [name] names [name] first, Milly Jakob second her healthcare surrogates.  No HCPOA paperwork noted in Epic chart.    SUMMARY OF RECOMMENDATIONS   Continue to treat the treatable. Declines blood products due to faith, Jehovah's Witness. Also declines hemodialysis due to faith, Jehovah's Witness. Continue CODE STATUS discussions.  Code Status/Advance Care Planning:  Full code  Symptom Management:   Per hospitalist, no additional needs at this time.   Palliative Prophylaxis:   Frequent Pain Assessment and Turn Reposition  Additional Recommendations (Limitations, Scope,  Preferences):  Full Scope Treatment and No Blood Transfusions  Psycho-social/Spiritual:   Desire for further Chaplaincy support:no  Additional Recommendations: Caregiving  Support/Resources and Education on Hospice  Prognosis:   < 6 months, 6 or less would not be surprising based on frailty, poor functional status, chronic disease burden, patient's faith which does not allow her to take blood or have dialysis.  Complicated by relatively young age of 18.  Discharge Planning: anticipate return to [facility]       Primary Diagnoses: Present on Admission: . Respiratory failure ([place])   I have reviewed the medical record, interviewed the patient and family, and examined the patient. The following aspects are pertinent.  Past Medical History:  Diagnosis Date  . Anemia   . CHF (congestive heart failure) ([place])   . CKD (chronic kidney disease) stage 4, GFR 15-29 ml/min (HCC)   . Diabetes mellitus without complication ([place])   . Hypertension   . Patient is Sales promotion account executive Witness    Social History   Socioeconomic History  . Marital status: Divorced    Spouse name: Not on file  . Number of children: Not on file  . Years of  education: Not on file  . Highest education level: Not on file  Occupational History  . Not on file  Social Needs  . Financial resource strain: Not on file  . Food insecurity:    Worry: Not on file    Inability: Not on file  . Transportation needs:    Medical: Not on file    Non-medical: Not on file  Tobacco Use  . Smoking status: Former Research scientist (life sciences)  . Smokeless tobacco: Never Used  Substance and Sexual Activity  . Alcohol use: Not Currently  . Drug use: Never  . Sexual activity: Not on file  Lifestyle  . Physical activity:    Days per week: Not on file    Minutes per session: Not on file  . Stress: Not on file  Relationships  . Social connections:    Talks on phone: Not on file    Gets together: Not on file    Attends religious service: Not on file    Active  member of club or organization: Not on file    Attends meetings of clubs or organizations: Not on file    Relationship status: Not on file  Other Topics Concern  . Not on file  Social History Narrative  . Not on file   History reviewed. No pertinent family history. Scheduled Meds: . amLODipine  2.5 mg Oral Daily  . atorvastatin  80 mg Oral Daily  . carvedilol  25 mg Oral BID  . cholecalciferol  5,000 Units Oral Daily  . [START ON 07/18/2018] epoetin alfa-epbx  15,000 Units Subcutaneous Q Fri  . feeding supplement (PRO-STAT SUGAR FREE 64)  30 mL Oral BID  . furosemide  80 mg Intravenous BID  . guaiFENesin  600 mg Oral BID  . heparin  5,000 Units Subcutaneous Q8H  . insulin aspart  0-20 Units Subcutaneous TID WC  . insulin aspart  0-5 Units Subcutaneous QHS  . ipratropium-albuterol  3 mL Nebulization QID  . isosorbide mononitrate  60 mg Oral Daily  . methylPREDNISolone (SOLU-MEDROL) injection  60 mg Intravenous Q6H  . metolazone  2.5 mg Oral Daily  . multivitamin with minerals  1 tablet Oral Daily  . silver sulfADIAZINE  1 application Topical TID  . sodium chloride flush  3 mL Intravenous Q12H  . ascorbic acid  500 mg Oral BID   Continuous Infusions: . sodium chloride    . doxycycline (VIBRAMYCIN) IV 100 mg (07/17/18 0917)   PRN Meds:.sodium chloride, acetaminophen, ALPRAZolam, loperamide, ondansetron (ZOFRAN) IV, polyethylene glycol, sodium chloride flush Medications Prior to Admission:  Prior to Admission medications   Medication Sig Start Date End Date Taking? Authorizing Provider  acetaminophen (TYLENOL) 325 MG tablet Take 2 tablets (650 mg total) by mouth every 6 (six) hours as needed for mild pain (or Fever >/= 101). 07/01/18  Yes Gladstone Lighter, MD  Amino Acids-Protein Hydrolys (FEEDING SUPPLEMENT, PRO-STAT SUGAR FREE 64,) LIQD Take 30 mLs by mouth 2 (two) times daily.   Yes [provider]  amLODipine (NORVASC) 10 MG tablet Take 1 tablet (10 mg total) by mouth  daily. 07/02/18  Yes Gladstone Lighter, MD  ascorbic acid (VITAMIN C) 250 MG tablet Take 500 mg by mouth 2 (two) times daily.   Yes [provider]  atorvastatin (LIPITOR) 80 MG tablet Take 80 mg by mouth daily.   Yes [provider]  bumetanide (BUMEX) 1 MG tablet Take 1 tablet (1 mg total) by mouth 2 (two) times daily. 07/01/18  Yes Kalisetti, Hart [place],  MD  carvedilol (COREG) 25 MG tablet Take 1 tablet by mouth 2 (two) times daily. 05/16/18 07/17/18 Yes [provider]  Cholecalciferol (VITAMIN D3) 125 MCG (5000 UT) CAPS Take 5,000 Units by mouth daily.    Yes [provider]  dextromethorphan-guaiFENesin (MUCINEX DM) 30-600 MG 12hr tablet Take 1 tablet by mouth 2 (two) times daily as needed for cough.   Yes [provider]  epoetin alfa-epbx (RETACRIT) 41638 UNIT/ML injection 15,000 Units every Friday.   Yes [provider]  ipratropium-albuterol (DUONEB) 0.5-2.5 (3) MG/3ML SOLN Take 3 mLs by nebulization every 6 (six) hours as needed (wheezing, shortness of breath). Patient taking differently: Take 3 mLs by nebulization 4 (four) times daily.  07/01/18  Yes Gladstone Lighter, MD  isosorbide mononitrate (IMDUR) 60 MG 24 hr tablet Take 1 tablet (60 mg total) by mouth daily. 05/30/18  Yes Bettey Costa, MD  loperamide (IMODIUM) 2 MG capsule Take 2 mg by mouth every 6 (six) hours as needed for diarrhea or loose stools.   Yes [provider]  metolazone (ZAROXOLYN) 2.5 MG tablet Take 2.5 mg by mouth daily.   Yes [provider]  Multiple Vitamin (MULTIVITAMIN) tablet Take 1 tablet by mouth daily.   Yes [provider]  polyethylene glycol (MIRALAX / GLYCOLAX) packet Take 17 g by mouth daily as needed for mild constipation. 07/01/18  Yes Gladstone Lighter, MD  silver sulfADIAZINE (SILVADENE) 1 % cream Apply 1 application topically 3 (three) times daily. Apply to sacrum and bilateral buttocks.   Yes [provider]    Allergies  Allergen Reactions  . Penicillins Rash   Review of Systems  Unable to perform ROS: Other    Physical Exam Vitals signs and nursing note reviewed.  HENT:     Head: Atraumatic.  Pulmonary:     Effort: Pulmonary effort is normal. No respiratory distress.     Vital Signs: BP 131/63 (BP Location: Right Leg)   Pulse 70   Temp 98.1 F (36.7 C) (Oral)   Resp 18   Ht 5\' 5"  (1.651 m)   Wt 59 kg   SpO2 98%   BMI 21.63 kg/m  Pain Scale: 0-10   Pain Score: 0-No pain   SpO2: SpO2: 98 % O2 Device:SpO2: 98 % O2 Flow Rate: .O2 Flow Rate (L/min): 4 L/min  IO: Intake/output summary:   Intake/Output Summary (Last 24 hours) at 07/17/2018 1536 Last data filed at 07/17/2018 0557 Gross per 24 hour  Intake 100 ml  Output -  Net 100 ml    LBM: Last BM Date: 07/16/18 Baseline Weight: Weight: 59 kg Most recent weight: Weight: 59 kg     Palliative Assessment/Data:   Flowsheet Rows     Most Recent Value  Intake Tab  Referral Department  Hospitalist  Unit at Time of Referral  Cardiac/Telemetry Unit  Palliative Care Primary Diagnosis  Cardiac  Date Notified  07/17/18  Palliative Care Type  Return patient Palliative Care  Reason for referral  Clarify Goals of Care  Date of Admission  07/17/18  Date first seen by Palliative Care  07/17/18  # of days Palliative referral response time  0 Day(s)  # of days IP prior to Palliative referral  0  Clinical Assessment  Palliative Performance Scale Score  40%  Pain Max last 24 hours  Not able to report  Pain Min Last 24 hours  Not able to report  Dyspnea Max Last 24 Hours  Not able to report  Dyspnea  Min Last 24 hours  Not able to report  Psychosocial & Spiritual Assessment  Palliative Care Outcomes  Patient/Family meeting held?  Yes  Who was at the meeting?  patient at bedside.       Time In: 1530 Time Out: 1605 Time Total: 35 minutes  Greater than 50%  of this time was spent counseling and coordinating care related  to the above assessment and plan.  Signed by: Drue Novel, NP   Please contact Palliative Medicine Team phone at [phone] for questions and concerns.  For individual provider: See Shea Evans

## 2018-07-17 NOTE — Progress Notes (Addendum)
[facility] [place] NAME: Jenny [name]    MR#:  [id number]  DATE OF BIRTH:  10-09-1955  SUBJECTIVE:  CHIEF COMPLAINT: Shortness of breath  This morning patient reports some improvement.  Initially agreed to have thoracentesis done due to left-sided pleural effusion.  This morning patient now declining to have thoracentesis done.  Appears to have refused same as well during previous admission.  Patient awake and alert and able to make her own medical decisions.  REVIEW OF SYSTEMS:  Review of Systems  Constitutional: Negative for chills and fever.  HENT: Negative for hearing loss and tinnitus.   Eyes: Negative for blurred vision and double vision.  Respiratory: Positive for cough and shortness of breath.   Cardiovascular: Negative for chest pain and palpitations.  Gastrointestinal: Negative for abdominal pain, heartburn, nausea and vomiting.  Genitourinary: Negative for dysuria and urgency.  Musculoskeletal: Negative for myalgias and neck pain.  Neurological: Negative for dizziness and headaches.  Psychiatric/Behavioral: Negative for depression and hallucinations.    DRUG ALLERGIES:   Allergies  Allergen Reactions  . Penicillins Rash   VITALS:  Blood pressure 132/67, pulse 65, resp. rate 18, height 5\' 5"  (1.651 m), weight 59 kg, SpO2 98 %. PHYSICAL EXAMINATION:   GENERAL:  63 y.o.-year-old patient lying in the bed with no acute distress.  Malnourished appearing EYES: Pupils equal, round, reactive to light and accommodation. No scleral icterus. Extraocular muscles intact.  HEENT: Head atraumatic, normocephalic. Oropharynx and nasopharynx clear.  NECK:  Supple, no jugular venous distention. No thyroid enlargement, no tenderness.  LUNGS: Diminished breath sounds with rhonchi/wheezing bilaterally but more on the left side.  No use of accessory muscles of respiration.  CARDIOVASCULAR: S1, S2 normal. No murmurs, rubs, or gallops.    ABDOMEN: Soft, nontender, nondistended. Bowel sounds present. No organomegaly or mass.  EXTREMITIES: + Bilateral lower extremity pitting edema, no cyanosis, or clubbing.  NEUROLOGIC: Cranial nerves II through XII are intact. MAES. Gait not checked.  PSYCHIATRIC: The patient is alert and oriented x 3.  SKIN: No obvious rash, lesion, or ulcer.   LABORATORY PANEL:  Female CBC Recent Labs  Lab 07/17/18 0320  WBC 8.9  HGB 7.7*  HCT 26.5*  PLT 176   ------------------------------------------------------------------------------------------------------------------ Chemistries  Recent Labs  Lab 07/17/18 0320  NA 139  K 4.5  CL 111  CO2 21*  GLUCOSE 246*  BUN 103*  CREATININE 3.49*  CALCIUM 6.3*   RADIOLOGY:  Dg Chest Port 1 View  Result Date: 07/17/2018 CLINICAL DATA:  Difficulty breathing EXAM: PORTABLE CHEST 1 VIEW COMPARISON:  06/28/2018 FINDINGS: Cardiomegaly with vascular congestion. Moderate left pleural effusion with left lower lobe atelectasis. Mild interstitial prominence likely reflects interstitial edema, similar to prior study. IMPRESSION: Cardiomegaly with vascular congestion and mild interstitial edema. Moderate left effusion with left base atelectasis. Electronically Signed   By: Rolm Baptise M.D.   On: 07/17/2018 03:31   ASSESSMENT AND PLAN:   63 year old female past medical history significant for chronic respiratory failure secondary to COPD, CHF on home oxygen, CKD stage IV, diabetes, hypertension, frequent hospitalizations for similar presentation, presents to hospital from [facility] secondary to recurrent respiratory failure  1.Acute on chronic hypoxic respiratory failure secondary to multifactorial process that includes acute on chronic systolic congestive heart failure/COPD exacerbation, acute on chronic left pleural effusion, and chronic severe anemia of chronic disease  Noted frequent hospitalizations for same, discharged approximately 2 weeks ago-had  refused thoracentesis at that time, [facility]  Witness-refuses blood transfusions Refer to the observation unit, supplemental oxygen wean as tolerated-on 2 L chronically at the nursing home  2. Acute on chronic systolic CHF exacerbation most recent echocardiogram with EF of 40 to 37% and diastolic dysfunction Congestive heart failure protocol, strict I&O monitoring, daily weights, IV Lasix twice daily, Zaroxolyn,  continue Coreg.  No ACE inhibitor due to underlying kidney disease  3. Acute on COPD exacerbation  IV Solu-Medrol with tapering as tolerated, aggressive pulmonary toilet with bronchodilator therapy, mucolytic agents, empiric doxycycline, respiratory therapy to see   4. Acute on chronic left pleural effusion  Moderate to large in size  Patient now refusing thoracentesis this morning.  Previously refused same on last admission. Continue diuresis with Lasix.  Follow-up repeat chest x-ray in a.m.  5.  CKD stage IV At baseline Avoid nephrotoxic agents, strict I&O monitoring, daily weights Patient follows with [facility] nephrology Most recent renal ultrasound with increased echotexture of bilateral kidneys  6. Chronic adult failure to thrive  Increase nursing care PRN, aspiration/fall/skin care precautions while in house, palliative care consulted given long-term poor prognosis   7. Chronic microcytic anemia patient is a Jehovah's Witness Hemoglobin is stable and at 7.7 Epogen weekly will be continued   8. Chronic hypertension  Stable  Continue Imdur, Coreg and Norvasc  DVT prophylaxis; heparin  I called and updated patient's daughter Mr. Tonna Boehringer on treatment plans as outlined above.  All questions were answered and he is in agreement with the plan of care as outlined above.  All the records are reviewed and case discussed with Care Management/Social Worker. Management plans discussed with the patient, and she is in agreement.  CODE STATUS: Full Code  TOTAL TIME  TAKING CARE OF THIS PATIENT: 28 minutes.   More than 50% of the time was spent in counseling/coordination of care: YES  POSSIBLE D/C IN 2 DAYS, DEPENDING ON CLINICAL CONDITION.   Kerianne Gurr M.D on 07/17/2018 at 2:39 PM  Between 7am to 6pm - Pager - [phone]  After 6pm go to [email or web address] - Proofreader  [facility]  Office  [phone]  CC: Primary care physician; Juluis Pitch, MD  Note: This dictation was prepared with Dragon dictation along with smaller phrase technology. Any transcriptional errors that result from this process are unintentional.

## 2018-07-17 NOTE — ED Notes (Signed)
Spoke with pt about the procedure and her reason for not wanting it, pt states "the pain" I explained that the procedure would make her fell better and the pt is willing to have it done.

## 2018-07-17 NOTE — Care Management Note (Signed)
Case Management Note  Patient Details  Name: Esparanza Krider MRN: [id number] Date of Birth: Oct 15, 1955  Subjective/Objective:     Patient is admitted from [facility]; recently discharged 2 weeks ago.  Admitted with respiratory failure.  History of COPD/CHF.  BNP 1386.  Currently on 4L oxygen; normally on 2L.  Left pleural effusion; refusing thoracentesis.  Receiving IV lasix twice daily and IV solumedrol.  Jehovah's Witness with chronic anemia.  Needs a PT evaluation placed for possible return to [facility].  Randall Hiss, CSW has spoken to [place], patients son.                 Action/Plan:   Expected Discharge Date:  07/19/18               Expected Discharge Plan:  Skilled Nursing Facility  In-House Referral:  Clinical Social Work  Discharge planning Services  CM Consult  Post Acute Care Choice:    Choice offered to:     DME Arranged:    DME Agency:     HH Arranged:    [place] Agency:     Status of Service:  In process, will continue to follow  If discussed at Long Length of Stay Meetings, dates discussed:    Additional Comments:  Elza Rafter, RN 07/17/2018, 3:31 PM

## 2018-07-17 NOTE — Procedures (Signed)
Pre procedural Dx: Symptomatic Pleural effusion Post procedural Dx: Same  Successful US guided left sided thoracentesis yielding 600 cc of serous pleural fluid.   Samples sent to lab for analysis.  EBL: None  Complications: None immediate.  Ronny Bacon, MD Pager #: [phone]

## 2018-07-17 NOTE — Clinical Social Work Note (Signed)
CSW received phone call from patient's son Nicole Kindred [phone], he was asking how patient can apply for disability.  CSW informed him that patient would have to contact DSS or Social Security Administration.  CSW provided phone numbers for both services, CSW also suggested that they talk to a Elder Law attorney if they want to expedite the process.  CSW provided local Elder Law firm number for patient's son to explore options.  [name] would like patient to return to [facility], CSW will speak with patient at a later time to see if she is in agreement to the plan.  Patient will need updated PT notes so patient can be approved by insurance company.  Jones Broom. Pey[place], MSW, [place]  07/17/2018 5:29 PM

## 2018-07-17 NOTE — ED Provider Notes (Addendum)
[facility] Emergency Department Provider Note _________   First MD Initiated Contact with Patient 07/17/18 [phone]     (approximate)  I have reviewed the triage vital signs and the nursing notes.   HISTORY  Chief Complaint No chief complaint on file.    HPI Jenny [name] is a 63 y.o. female with history of chronic kidney disease, COPD CHF diabetes presents to the emergency department via EMS with progressive dyspnea which began this evening per the patient.  EMS states that the facility where the patient resides ([facility]) administered 5 albuterol treatments before their arrival without any improvement.  EMS administered an additional albuterol treatment while in route.  Patient also admits to progressive bilateral lower extremity edema which also began today.  Patient denies any chest pain.  Past Medical History:  Diagnosis Date  . Anemia   . CHF (congestive heart failure) ([place])   . CKD (chronic kidney disease) stage 4, GFR 15-29 ml/min (HCC)   . Diabetes mellitus without complication ([place])   . Hypertension   . Patient is JASNK[employer] Witness     Patient Active Problem List   Diagnosis Date Noted  . Respiratory failure ([place]) 06/26/2018  . Goals of care, counseling/discussion   . Palliative care by specialist   . DNR (do not resuscitate) discussion   . CHF (congestive heart failure) ([place]) 05/29/2018    History reviewed. No pertinent surgical history.  Prior to Admission medications   Medication Sig Start Date End Date Taking? Authorizing Provider  acetaminophen (TYLENOL) 325 MG tablet Take 2 tablets (650 mg total) by mouth every 6 (six) hours as needed for mild pain (or Fever >/= 101). 07/01/18  Yes Gladstone Lighter, MD  Amino Acids-Protein Hydrolys (FEEDING SUPPLEMENT, PRO-STAT SUGAR FREE 64,) LIQD Take 30 mLs by mouth 2 (two) times daily.   Yes [provider]  amLODipine (NORVASC) 10 MG tablet Take 1 tablet (10 mg total) by mouth  daily. 07/02/18  Yes Gladstone Lighter, MD  ascorbic acid (VITAMIN C) 250 MG tablet Take 500 mg by mouth 2 (two) times daily.   Yes [provider]  atorvastatin (LIPITOR) 80 MG tablet Take 80 mg by mouth daily.   Yes [provider]  bumetanide (BUMEX) 1 MG tablet Take 1 tablet (1 mg total) by mouth 2 (two) times daily. 07/01/18  Yes Gladstone Lighter, MD  carvedilol (COREG) 25 MG tablet Take 1 tablet by mouth 2 (two) times daily. 05/16/18 07/17/18 Yes [provider]  Cholecalciferol (VITAMIN D3) 125 MCG (5000 UT) CAPS Take 5,000 Units by mouth daily.    Yes [provider]  dextromethorphan-guaiFENesin (MUCINEX DM) 30-600 MG 12hr tablet Take 1 tablet by mouth 2 (two) times daily as needed for cough.   Yes [provider]  epoetin alfa-epbx (RETACRIT) 76734 UNIT/ML injection 15,000 Units every Friday.   Yes [provider]  ipratropium-albuterol (DUONEB) 0.5-2.5 (3) MG/3ML SOLN Take 3 mLs by nebulization every 6 (six) hours as needed (wheezing, shortness of breath). Patient taking differently: Take 3 mLs by nebulization 4 (four) times daily.  07/01/18  Yes Gladstone Lighter, MD  isosorbide mononitrate (IMDUR) 60 MG 24 hr tablet Take 1 tablet (60 mg total) by mouth daily. 05/30/18  Yes Bettey Costa, MD  loperamide (IMODIUM) 2 MG capsule Take 2 mg by mouth every 6 (six) hours as needed for diarrhea or loose stools.   Yes [provider]  metolazone (ZAROXOLYN) 2.5 MG tablet Take 2.5 mg by mouth daily.  Yes [provider]  Multiple Vitamin (MULTIVITAMIN) tablet Take 1 tablet by mouth daily.   Yes [provider]  polyethylene glycol (MIRALAX / GLYCOLAX) packet Take 17 g by mouth daily as needed for mild constipation. 07/01/18  Yes Gladstone Lighter, MD  silver sulfADIAZINE (SILVADENE) 1 % cream Apply 1 application topically 3 (three) times daily. Apply to sacrum and bilateral buttocks.   Yes [provider]     Allergies Penicillins  History reviewed. No pertinent family history.  Social History Social History   Tobacco Use  . Smoking status: Former Research scientist (life sciences)  . Smokeless tobacco: Never Used  Substance Use Topics  . Alcohol use: Not Currently  . Drug use: Never    Review of Systems Constitutional: No fever/chills Eyes: No visual changes. ENT: No sore throat. Cardiovascular: Denies chest pain. Respiratory: Positive for shortness of breath. Gastrointestinal: No abdominal pain.  No nausea, no vomiting.  No diarrhea.  No constipation. Genitourinary: Negative for dysuria. Musculoskeletal: Negative for neck pain.  Negative for back pain.  Positive for lower extremity swelling Integumentary: Negative for rash. Neurological: Negative for headaches, focal weakness or numbness.   ____________________________________________   PHYSICAL EXAM:  VITAL SIGNS: ED Triage Vitals  Enc Vitals Group     BP 07/17/18 0317 122/62     Pulse Rate 07/17/18 0317 60     Resp 07/17/18 0317 18     Temp --      Temp Source 07/17/18 0317 Oral     SpO2 07/17/18 0317 93 %     Weight 07/17/18 0318 59 kg (130 lb)     Height 07/17/18 0318 1.651 m (5\' 5" )     Head Circumference --      [place] --      Pain Score 07/17/18 0318 0     Pain Loc --      Pain Edu? --      Excl. in [place]? --     Constitutional: Alert and oriented.  Apparent respiratory difficulty eyes: Conjunctivae are normal. Mouth/Throat: Mucous membranes are moist.  Oropharynx non-erythematous. Neck: No stridor.  Cardiovascular: Normal rate, regular rhythm. Good peripheral circulation. Grossly normal heart sounds. Respiratory: Normal respiratory effort.  No retractions.  Bibasilar rales  gastrointestinal: Soft and nontender. No distention.  Musculoskeletal: 2+ bilateral lower extremity pitting edema Neurologic:  Normal speech and language. No gross focal neurologic deficits are appreciated.  Skin:  Skin is warm, dry and intact. No rash  noted. Psychiatric: Mood and affect are normal. Speech and behavior are normal.  ____________________________________________   LABS (all labs ordered are listed, but only abnormal results are displayed)  Labs Reviewed  CBC WITH DIFFERENTIAL/PLATELET - Abnormal; Notable for the following components:      Result Value   RBC 2.72 (*)    Hemoglobin 7.7 (*)    HCT 26.5 (*)    MCHC 29.1 (*)    RDW 19.8 (*)    Abs Immature Granulocytes 0.08 (*)    All other components within normal limits  BASIC METABOLIC PANEL - Abnormal; Notable for the following components:   CO2 21 (*)    Glucose, Bld 246 (*)    BUN 103 (*)    Creatinine, Ser 3.49 (*)    Calcium 6.3 (*)    GFR calc non Af Amer 13 (*)    GFR calc Af Amer 15 (*)    All other components within normal limits  BRAIN NATRIURETIC PEPTIDE - Abnormal; Notable for the following components:  B Natriuretic Peptide 1,386.0 (*)    All other components within normal limits   ____________________________________________  EKG  ED ECG REPORT I, [place] N BROWN, the attending physician, personally viewed and interpreted this ECG.   Date: 07/17/2018  EKG Time: 5:42 AM  Rate: 64  Rhythm: Normal sinus rhythm with premature ventricular contraction  Axis: Normal  Intervals: Normal  ST&T Change: None  ____________________________________________  RADIOLOGY I, [place] N BROWN, personally viewed and evaluated these images (plain radiographs) as part of my medical decision making, as well as reviewing the written report by the radiologist.  ED MD interpretation: Cardiomegaly with vascular congestion and interstitial edema noted on chest x-ray per radiologist.  Also moderate left effusion  Official radiology report(s): Dg Chest Port 1 View  Result Date: 07/17/2018 CLINICAL DATA:  Difficulty breathing EXAM: PORTABLE CHEST 1 VIEW COMPARISON:  06/28/2018 FINDINGS: Cardiomegaly with vascular congestion. Moderate left pleural effusion with  left lower lobe atelectasis. Mild interstitial prominence likely reflects interstitial edema, similar to prior study. IMPRESSION: Cardiomegaly with vascular congestion and mild interstitial edema. Moderate left effusion with left base atelectasis. Electronically Signed   By: Rolm Baptise M.D.   On: 07/17/2018 03:31    ______  Critical Care performed:  .Critical Care Performed by: Gregor Hams, MD Authorized by: Gregor Hams, MD   Critical care provider statement:    Critical care time (minutes):  30   Critical care time was exclusive of:  Separately billable procedures and treating other patients   Critical care was necessary to treat or prevent imminent or life-threatening deterioration of the following conditions:  Respiratory failure   Critical care was time spent personally by me on the following activities:  Development of treatment plan with patient or surrogate, discussions with consultants, evaluation of patient's response to treatment, examination of patient, obtaining history from patient or surrogate, ordering and performing treatments and interventions, ordering and review of laboratory studies, ordering and review of radiographic studies, pulse oximetry, re-evaluation of patient's condition and review of old charts     ____________________________________________   INITIAL IMPRESSION / ASSESSMENT AND PLAN / ED COURSE  As part of my medical decision making, I reviewed the following data within the electronic MEDICAL RECORD NUMBER   63 year old female presented with above-stated history and physical exam with differential diagnosis including CHF exacerbation pneumonia COPD exacerbation and less likely pulmonary emboli.  Local exam and chest x-ray most consistent with CHF exacerbation with pulmonary edema.  As such patient given Lasix 40 mg IV on reevaluation patient states respiratory status much improved.  Patient's laboratory data revealed a BNP of 1386 as well as known  chronic renal insufficiency with a GFR of 15.  Of note patient also has a calcium level of 6.3 for which patient received 1 g calcium gluconate IV.  Patient discussed with Dr. Jerelyn Charles for hospital admission for acute respiratory failure with hypoxia most likely secondary to congestive heart failure exacerbation. ____________________________________________  FINAL CLINICAL IMPRESSION(S) / ED DIAGNOSES  Final diagnoses:  Acute on chronic congestive heart failure, unspecified heart failure type ([place])  Acute respiratory failure with hypoxia (HCC)  Hypocalcemia     MEDICATIONS GIVEN DURING THIS VISIT:  Medications  calcium gluconate 1 g in sodium chloride 0.9 % 100 mL IVPB (1 g Intravenous New Bag/Given 07/17/18 0527)  furosemide (LASIX) injection 40 mg (40 mg Intravenous Given 07/17/18 0326)  methylPREDNISolone sodium succinate (SOLU-MEDROL) 125 mg/2 mL injection 125 mg (125 mg Intravenous Given 07/17/18 0326)  ED Discharge Orders         Ordered    Amb Referral to HF Clinic     07/17/18 0514           Note:  This document was prepared using Dragon voice recognition software and may include unintentional dictation errors.   Gregor Hams, MD 07/17/18 7017    Gregor Hams, MD 07/31/18 [phone]

## 2018-07-17 NOTE — ED Notes (Signed)
ED TO INPATIENT HANDOFF REPORT  ED Nurse Name and Phone #: Ignatius Specking [phone]  Name/Age/Gender Jenny [name] 63 y.o. female Room/Bed: ED04A/ED04A  Code Status   Code Status: Full Code  Home/SNF/Other Skilled nursing facility Patient oriented to: self, place, time and situation Is this baseline? Yes   Triage Complete: Triage complete  Chief Complaint breathing difficulty   Triage Note Pt arrived from [facility] via EMS with complaints of difficulty breathing that started today. Facility gave pt 5 Albuterol treatments. Pt is normally on 2L at facility. EMS reported that wheezing was heard in all fields. Pt has Hx of COPD and Heart Failure. EMS gave 1 Albuterol Tx en route. EMS states VS WNL BP-135/72 HR-70s. Upon inspection pt legs have edema bilaterally. Dr. Owens Shark at bedside.   Allergies Allergies  Allergen Reactions  . Penicillins Rash    Level of Care/Admitting Diagnosis ED Disposition    ED Disposition Condition [facility] Area: [place] [[id number]]  Level of Care: Telemetry [5]  Diagnosis: Respiratory failure [facility]) [id number]  Admitting Physician: Gorden Harms [[id number]]  Attending Physician: Gorden Harms [[id number]]  PT Class (Do Not Modify): Observation [104]  PT Acc Code (Do Not Modify): Observation [10022]       Medical/Surgery History Past Medical History:  Diagnosis Date  . Anemia   . CHF (congestive heart failure) ([place])   . CKD (chronic kidney disease) stage 4, GFR 15-29 ml/min (HCC)   . Diabetes mellitus without complication ([place])   . Hypertension   . Patient is Jehovah's Witness    History reviewed. No pertinent surgical history.   IV Location/Drains/Wounds Patient Lines/Drains/Airways Status   Active Line/Drains/Airways    Name:   Placement date:   Placement time:   Site:   Days:   Peripheral IV 07/17/18 Right Forearm   07/17/18    0322    Forearm   less than 1   External Urinary Catheter   06/27/18     1544    no documentation   20          Intake/Output Last 24 hours  Intake/Output Summary (Last 24 hours) at 07/17/2018 1025 Last data filed at 07/17/2018 0557 Gross per 24 hour  Intake 100 ml  Output no documentation  Net 100 ml    Labs/Imaging Results for orders placed or performed during the hospital encounter of 07/17/18 (from the past 48 hour(s))  CBC with Differential     Status: Abnormal   Collection Time: 07/17/18  3:20 AM  Result Value Ref Range   WBC 8.9 4.0 - 10.5 K/uL   RBC 2.72 (L) 3.87 - 5.11 MIL/uL   Hemoglobin 7.7 (L) 12.0 - 15.0 g/dL   HCT 26.5 (L) 36.0 - 46.0 %   MCV 97.4 80.0 - 100.0 fL   MCH 28.3 26.0 - 34.0 pg   MCHC 29.1 (L) 30.0 - 36.0 g/dL   RDW 19.8 (H) 11.5 - 15.5 %   Platelets 176 150 - 400 K/uL   nRBC 0.0 0.0 - 0.2 %   Neutrophils Relative % 85 %   Neutro Abs 7.6 1.7 - 7.7 K/uL   Lymphocytes Relative 10 %   Lymphs Abs 0.9 0.7 - 4.0 K/uL   Monocytes Relative 3 %   Monocytes Absolute 0.3 0.1 - 1.0 K/uL   Eosinophils Relative 1 %   Eosinophils Absolute 0.1 0.0 - 0.5 K/uL   Basophils Relative 0 %   Basophils Absolute 0.0 0.0 -  0.1 K/uL   Immature Granulocytes 1 %   Abs Immature Granulocytes 0.08 (H) 0.00 - 0.07 K/uL    Comment: Performed at [facility], [address] [place], [place] [address]  Basic metabolic panel     Status: Abnormal   Collection Time: 07/17/18  3:20 AM  Result Value Ref Range   Sodium 139 135 - 145 mmol/L   Potassium 4.5 3.5 - 5.1 mmol/L   Chloride 111 98 - 111 mmol/L   CO2 21 (L) 22 - 32 mmol/L   Glucose, Bld 246 (H) 70 - 99 mg/dL   BUN 103 (H) 8 - 23 mg/dL    Comment: RESULT CONFIRMED BY MANUAL DILUTION   Creatinine, Ser 3.49 (H) 0.44 - 1.00 mg/dL   Calcium 6.3 (LL) 8.9 - 10.3 mg/dL    Comment: CRITICAL RESULT CALLED TO, READ BACK BY AND VERIFIED WITH HENRY RIVEOA @437  07/17/2018 TTG    GFR calc non Af Amer 13 (L) >60 mL/min   GFR calc Af Amer 15 (L) >60 mL/min   Anion gap 7 5 - 15    Comment:  Performed at [facility], [address], [place], [place] [address]  Brain natriuretic peptide     Status: Abnormal   Collection Time: 07/17/18  3:20 AM  Result Value Ref Range   B Natriuretic Peptide 1,386.0 (H) 0.0 - 100.0 pg/mL    Comment: Performed at [facility], [address] [place], [place] [address]  Troponin I - Now Then Q6H     Status: Abnormal   Collection Time: 07/17/18  3:20 AM  Result Value Ref Range   Troponin I 0.03 (HH) <0.03 ng/mL    Comment: CRITICAL RESULT CALLED TO, READ BACK BY AND VERIFIED WITH STEPHANIE Ariannie Penaloza AT 0927 ON 07/17/2018 SMA Performed at [facility], [address], [place], [place] [address]    Dg Chest Port 1 View  Result Date: 07/17/2018 CLINICAL DATA:  Difficulty breathing EXAM: PORTABLE CHEST 1 VIEW COMPARISON:  06/28/2018 FINDINGS: Cardiomegaly with vascular congestion. Moderate left pleural effusion with left lower lobe atelectasis. Mild interstitial prominence likely reflects interstitial edema, similar to prior study. IMPRESSION: Cardiomegaly with vascular congestion and mild interstitial edema. Moderate left effusion with left base atelectasis. Electronically Signed   By: Rolm Baptise M.D.   On: 07/17/2018 03:31    Pending Labs Unresulted Labs (From admission, onward)    Start     Ordered   07/18/18 5329  Basic metabolic panel  Daily,   STAT     07/17/18 0809   07/17/18 0810  Troponin I - Now Then Q6H  Now then every 6 hours,   STAT     07/17/18 0809          Vitals/Pain Today's Vitals   07/17/18 0745 07/17/18 0830 07/17/18 0900 07/17/18 1020  BP:  (Abnormal) 151/91 (Abnormal) 152/86 (Abnormal) 154/81  Pulse: 67 68 72 73  Resp: 17 15 13  (Abnormal) 22  TempSrc:      SpO2: 94% 93% 96% 98%  Weight:      Height:      PainSc:        Isolation Precautions No active isolations  Medications Medications  amLODipine (NORVASC) tablet 2.5 mg (2.5 mg Oral Given 07/17/18 1018)  atorvastatin (LIPITOR)  tablet 80 mg (80 mg Oral Given 07/17/18 1017)  carvedilol (COREG) tablet 25 mg (25 mg Oral Given 07/17/18 1017)  isosorbide mononitrate (IMDUR) 24 hr tablet 60 mg (60 mg Oral Given  07/17/18 1017)  metolazone (ZAROXOLYN) tablet 2.5 mg (2.5 mg Oral Given 07/17/18 1017)  loperamide (IMODIUM) capsule 2 mg (has no administration in time range)  polyethylene glycol (MIRALAX / GLYCOLAX) packet 17 g (has no administration in time range)  epoetin alfa-epbx (RETACRIT) injection 15,000 Units (has no administration in time range)  feeding supplement (PRO-STAT SUGAR FREE 64) liquid 30 mL (has no administration in time range)  vitamin C (ASCORBIC ACID) tablet 500 mg (500 mg Oral Given 07/17/18 1018)  cholecalciferol (VITAMIN D) tablet 5,000 Units (5,000 Units Oral Given 07/17/18 1017)  multivitamin with minerals tablet 1 tablet (1 tablet Oral Given 07/17/18 1017)  ipratropium-albuterol (DUONEB) 0.5-2.5 (3) MG/3ML nebulizer solution 3 mL (3 mLs Nebulization Given 07/17/18 1017)  silver sulfADIAZINE (SILVADENE) 1 % cream 1 application (has no administration in time range)  sodium chloride flush (NS) 0.9 % injection 3 mL (3 mLs Intravenous Given 07/17/18 1018)  sodium chloride flush (NS) 0.9 % injection 3 mL (has no administration in time range)  0.9 %  sodium chloride infusion (has no administration in time range)  acetaminophen (TYLENOL) tablet 650 mg (has no administration in time range)  ondansetron (ZOFRAN) injection 4 mg (has no administration in time range)  heparin injection 5,000 Units ( Subcutaneous Not Given 07/17/18 0927)  furosemide (LASIX) injection 80 mg (80 mg Intravenous Not Given 07/17/18 0833)  ALPRAZolam Duanne Moron) tablet 0.25 mg (has no administration in time range)  methylPREDNISolone sodium succinate (SOLU-MEDROL) 125 mg/2 mL injection 60 mg (60 mg Intravenous Not Given 07/17/18 0833)  insulin aspart (novoLOG) injection 0-20 Units (has no administration in time range)  insulin aspart (novoLOG)  injection 0-5 Units (has no administration in time range)  guaiFENesin (MUCINEX) 12 hr tablet 600 mg (600 mg Oral Given 07/17/18 0917)  doxycycline (VIBRAMYCIN) 100 mg in sodium chloride 0.9 % 250 mL IVPB (100 mg Intravenous New Bag/Given 07/17/18 0917)  furosemide (LASIX) injection 40 mg (40 mg Intravenous Given 07/17/18 0326)  methylPREDNISolone sodium succinate (SOLU-MEDROL) 125 mg/2 mL injection 125 mg (125 mg Intravenous Given 07/17/18 0326)  calcium gluconate 1 g in sodium chloride 0.9 % 100 mL IVPB (0 g Intravenous Stopped 07/17/18 0557)  furosemide (LASIX) injection 40 mg (40 mg Intravenous Given 07/17/18 0604)    Mobility non-ambulatory Moderate fall risk   Focused Assessments Cardiac Assessment Handoff:  Cardiac Rhythm: Normal sinus rhythm Lab Results  Component Value Date   TROPONINI 0.03 ([place]) 07/17/2018   No results found for: DDIMER Does the Patient currently have chest pain? No     Recommendations: See Admitting Provider Note  Report given to:   Additional Notes: 0

## 2018-07-17 NOTE — ED Notes (Signed)
Per Dr. Owens Shark, okay for pt to have [place] sandwich tray. Pt given [place] sandwich tray.

## 2018-07-17 NOTE — ED Notes (Signed)
Chaplain at the bedside. 

## 2018-07-17 NOTE — ED Notes (Signed)
Pt requesting breakfast, waiting for dietary consult to be completed.

## 2018-07-18 ENCOUNTER — Observation Stay: Payer: BC Managed Care – PPO

## 2018-07-18 DIAGNOSIS — J9621 Acute and chronic respiratory failure with hypoxia: Secondary | ICD-10-CM | POA: Diagnosis present

## 2018-07-18 DIAGNOSIS — L89151 Pressure ulcer of sacral region, stage 1: Secondary | ICD-10-CM | POA: Diagnosis present

## 2018-07-18 DIAGNOSIS — J90 Pleural effusion, not elsewhere classified: Secondary | ICD-10-CM | POA: Diagnosis not present

## 2018-07-18 DIAGNOSIS — E1122 Type 2 diabetes mellitus with diabetic chronic kidney disease: Secondary | ICD-10-CM | POA: Diagnosis present

## 2018-07-18 DIAGNOSIS — Z880 Allergy status to penicillin: Secondary | ICD-10-CM | POA: Diagnosis not present

## 2018-07-18 DIAGNOSIS — Z9981 Dependence on supplemental oxygen: Secondary | ICD-10-CM | POA: Diagnosis not present

## 2018-07-18 DIAGNOSIS — N179 Acute kidney failure, unspecified: Secondary | ICD-10-CM | POA: Diagnosis not present

## 2018-07-18 DIAGNOSIS — Z87891 Personal history of nicotine dependence: Secondary | ICD-10-CM | POA: Diagnosis not present

## 2018-07-18 DIAGNOSIS — D631 Anemia in chronic kidney disease: Secondary | ICD-10-CM | POA: Diagnosis present

## 2018-07-18 DIAGNOSIS — L89611 Pressure ulcer of right heel, stage 1: Secondary | ICD-10-CM | POA: Diagnosis present

## 2018-07-18 DIAGNOSIS — Z8249 Family history of ischemic heart disease and other diseases of the circulatory system: Secondary | ICD-10-CM | POA: Diagnosis not present

## 2018-07-18 DIAGNOSIS — I5043 Acute on chronic combined systolic (congestive) and diastolic (congestive) heart failure: Secondary | ICD-10-CM | POA: Diagnosis present

## 2018-07-18 DIAGNOSIS — E440 Moderate protein-calorie malnutrition: Secondary | ICD-10-CM | POA: Diagnosis present

## 2018-07-18 DIAGNOSIS — Z515 Encounter for palliative care: Secondary | ICD-10-CM | POA: Diagnosis not present

## 2018-07-18 DIAGNOSIS — R64 Cachexia: Secondary | ICD-10-CM | POA: Diagnosis present

## 2018-07-18 DIAGNOSIS — J441 Chronic obstructive pulmonary disease with (acute) exacerbation: Secondary | ICD-10-CM | POA: Diagnosis present

## 2018-07-18 DIAGNOSIS — R680 Hypothermia, not associated with low environmental temperature: Secondary | ICD-10-CM | POA: Diagnosis not present

## 2018-07-18 DIAGNOSIS — Z5329 Procedure and treatment not carried out because of patient's decision for other reasons: Secondary | ICD-10-CM | POA: Diagnosis not present

## 2018-07-18 DIAGNOSIS — J189 Pneumonia, unspecified organism: Secondary | ICD-10-CM | POA: Diagnosis not present

## 2018-07-18 DIAGNOSIS — R627 Adult failure to thrive: Secondary | ICD-10-CM | POA: Diagnosis present

## 2018-07-18 DIAGNOSIS — L89621 Pressure ulcer of left heel, stage 1: Secondary | ICD-10-CM | POA: Diagnosis present

## 2018-07-18 DIAGNOSIS — I130 Hypertensive heart and chronic kidney disease with heart failure and stage 1 through stage 4 chronic kidney disease, or unspecified chronic kidney disease: Secondary | ICD-10-CM | POA: Diagnosis present

## 2018-07-18 DIAGNOSIS — R0602 Shortness of breath: Secondary | ICD-10-CM | POA: Diagnosis not present

## 2018-07-18 DIAGNOSIS — I509 Heart failure, unspecified: Secondary | ICD-10-CM | POA: Diagnosis not present

## 2018-07-18 DIAGNOSIS — J440 Chronic obstructive pulmonary disease with acute lower respiratory infection: Secondary | ICD-10-CM | POA: Diagnosis not present

## 2018-07-18 DIAGNOSIS — N184 Chronic kidney disease, stage 4 (severe): Secondary | ICD-10-CM | POA: Diagnosis present

## 2018-07-18 DIAGNOSIS — D509 Iron deficiency anemia, unspecified: Secondary | ICD-10-CM | POA: Diagnosis present

## 2018-07-18 DIAGNOSIS — N390 Urinary tract infection, site not specified: Secondary | ICD-10-CM | POA: Diagnosis present

## 2018-07-18 DIAGNOSIS — Z7189 Other specified counseling: Secondary | ICD-10-CM | POA: Diagnosis not present

## 2018-07-18 LAB — GLUCOSE, CAPILLARY
GLUCOSE-CAPILLARY: 271 mg/dL — AB (ref 70–99)
Glucose-Capillary: 231 mg/dL — ABNORMAL HIGH (ref 70–99)
Glucose-Capillary: 252 mg/dL — ABNORMAL HIGH (ref 70–99)
Glucose-Capillary: 241 mg/dL — ABNORMAL HIGH (ref 70–99)

## 2018-07-18 LAB — BASIC METABOLIC PANEL
Anion gap: 10 (ref 5–15)
BUN: 110 mg/dL — ABNORMAL HIGH (ref 8–23)
CO2: 19 mmol/L — ABNORMAL LOW (ref 22–32)
Calcium: 6.6 mg/dL — ABNORMAL LOW (ref 8.9–10.3)
Chloride: 110 mmol/L (ref 98–111)
Creatinine, Ser: 3.55 mg/dL — ABNORMAL HIGH (ref 0.44–1.00)
GFR calc Af Amer: 15 mL/min — ABNORMAL LOW (ref >60)
GFR, EST NON AFRICAN AMERICAN: 13 mL/min — AB (ref >60)
GLUCOSE: 320 mg/dL — AB (ref 70–99)
Potassium: 4.1 mmol/L (ref 3.5–5.1)
Sodium: 139 mmol/L (ref 135–145)

## 2018-07-18 LAB — CBC
HCT: 25.9 % — ABNORMAL LOW (ref 36.0–46.0)
HEMOGLOBIN: 7.8 g/dL — AB (ref 12.0–15.0)
MCH: 28.6 pg (ref 26.0–34.0)
MCHC: 30.1 g/dL (ref 30.0–36.0)
MCV: 94.9 fL (ref 80.0–100.0)
Platelets: 186 10*3/uL (ref 150–400)
RBC: 2.73 MIL/uL — ABNORMAL LOW (ref 3.87–5.11)
RDW: 19.7 % — ABNORMAL HIGH (ref 11.5–15.5)
WBC: 7 10*3/uL (ref 4.0–10.5)
nRBC: 0 % (ref 0.0–0.2)

## 2018-07-18 LAB — MAGNESIUM: Magnesium: 1.9 mg/dL (ref 1.7–2.4)

## 2018-07-18 MED ORDER — EPOETIN ALFA 20000 UNIT/ML IJ SOLN
15000.0000 [IU] | INTRAMUSCULAR | Status: DC
  Administered 2018-07-18: 15000 [IU] via SUBCUTANEOUS
  Filled 2018-07-18 (×2): qty 1

## 2018-07-18 MED ORDER — GUAIFENESIN 100 MG/5ML PO SOLN
5.0000 mL | ORAL | Status: DC | PRN
  Administered 2018-07-20: 100 mg via ORAL
  Filled 2018-07-18 (×2): qty 5

## 2018-07-18 MED ORDER — INSULIN GLARGINE 100 UNIT/ML ~~LOC~~ SOLN
10.0000 [IU] | Freq: Every day | SUBCUTANEOUS | Status: DC
  Administered 2018-07-18 – 2018-07-21 (×4): 10 [IU] via SUBCUTANEOUS
  Filled 2018-07-18 (×5): qty 0.1

## 2018-07-18 MED ORDER — EPOETIN ALFA-EPBX 10000 UNIT/ML IJ SOLN: 15000.0000 [IU] | INTRAMUSCULAR | Status: DC

## 2018-07-18 NOTE — Progress Notes (Signed)
[place] [facility] [place] NAME: Jenny [name]    MR#:  [id number]  DATE OF BIRTH:  [birth year]  SUBJECTIVE:  CHIEF COMPLAINT: Shortness of breath  Shortness of breath gradually improving.  No fevers.  Updated son over the phone on treatment plans yesterday evening  REVIEW OF SYSTEMS:  Review of Systems  Constitutional: Negative for chills and fever.  HENT: Negative for hearing loss and tinnitus.   Eyes: Negative for blurred vision and double vision.  Respiratory: Positive for cough and shortness of breath.   Cardiovascular: Negative for chest pain and palpitations.  Gastrointestinal: Negative for abdominal pain, heartburn, nausea and vomiting.  Genitourinary: Negative for dysuria and urgency.  Musculoskeletal: Negative for myalgias and neck pain.  Neurological: Negative for dizziness and headaches.  Psychiatric/Behavioral: Negative for depression and hallucinations.    DRUG ALLERGIES:   Allergies  Allergen Reactions  . Penicillins Rash   VITALS:  Blood pressure (!) 146/67, pulse 61, temperature (!) 95.8 F (35.4 C), temperature source Axillary, resp. rate (!) 24, height 5\' 5"  (1.651 m), weight 59 kg, SpO2 97 %. PHYSICAL EXAMINATION:   GENERAL:  63 y.o.-year-old patient lying in the bed with no acute distress.  Malnourished appearing EYES: Pupils equal, round, reactive to light and accommodation. No scleral icterus. Extraocular muscles intact.  HEENT: Head atraumatic, normocephalic. Oropharynx and nasopharynx clear.  NECK:  Supple, no jugular venous distention. No thyroid enlargement, no tenderness.  LUNGS: Diminished breath sounds with rhonchi/wheezing bilaterally but more on the left side.  No use of accessory muscles of respiration.  CARDIOVASCULAR: S1, S2 normal. No murmurs, rubs, or gallops.  ABDOMEN: Soft, nontender, nondistended. Bowel sounds present. No organomegaly or mass.  EXTREMITIES: + Bilateral lower extremity pitting  edema, no cyanosis, or clubbing.  NEUROLOGIC: Cranial nerves II through XII are intact. MAES. Gait not checked.  PSYCHIATRIC: The patient is alert and oriented x 3.  SKIN: No obvious rash, lesion, or ulcer.   LABORATORY PANEL:  Female CBC Recent Labs  Lab 07/18/18 0437  WBC 7.0  HGB 7.8*  HCT 25.9*  PLT 186   ------------------------------------------------------------------------------------------------------------------ Chemistries  Recent Labs  Lab 07/18/18 0437  NA 139  K 4.1  CL 110  CO2 19*  GLUCOSE 320*  BUN 110*  CREATININE 3.55*  CALCIUM 6.6*  MG 1.9   RADIOLOGY:  Dg Chest 1 View  Result Date: 07/18/2018 CLINICAL DATA:  Pleural effusion follow-up EXAM: CHEST  1 VIEW COMPARISON:  Yesterday FINDINGS: Trace left pleural effusion. No pneumothorax. Interstitial coarsening at the bases with mild atelectasis. There is COPD by prior CT. Stable cardiac enlargement. IMPRESSION: Stable from yesterday.  No interval reaccumulation of pleural fluid. Electronically Signed   By: Monte Fantasia M.D.   On: 07/18/2018 04:58   ASSESSMENT AND PLAN:   63 year old female past medical history significant for chronic respiratory failure secondary to COPD, CHF on home oxygen, CKD stage IV, diabetes, hypertension, frequent hospitalizations for similar presentation, presents to hospital from [facility] secondary to recurrent respiratory failure  1.Acute on chronic hypoxic respiratory failure secondary to multifactorial process that includes acute on chronic systolic congestive heart failure/COPD exacerbation, acute on chronic left pleural effusion, and chronic severe anemia of chronic disease  Noted frequent hospitalizations for same, discharged approximately 2 weeks ago-had refused thoracentesis at that time, Jehovah's Witness-refuses blood transfusions Refer to the observation unit, supplemental oxygen wean as tolerated-on 2 L chronically at the nursing home  2. Acute on chronic  systolic  CHF exacerbation most recent echocardiogram with EF of 40 to 97% and diastolic dysfunction Congestive heart failure protocol, strict I&O monitoring, daily weights, IV Lasix twice daily, Zaroxolyn,  continue Coreg.  No ACE inhibitor due to underlying kidney disease  3. Acute on COPD exacerbation  IV Solu-Medrol with tapering as tolerated, aggressive pulmonary toilet with bronchodilator therapy, mucolytic agents, empiric doxycycline, respiratory therapy to see   4. Acute on chronic left pleural effusion  Moderate to large in size .  Patient status post thoracentesis by interventional radiologist yesterday.  About 600 cc taken out.  Requested for analysis of pleural fluid with multiple labs.  Follow-up on results  5.  CKD stage IV At baseline Avoid nephrotoxic agents, strict I&O monitoring, daily weights Patient follows with [facility] nephrology Most recent renal ultrasound with increased echotexture of bilateral kidneys  6. Chronic adult failure to thrive  Increase nursing care PRN, aspiration/fall/skin care precautions while in house, palliative care team following patient.     7. Chronic microcytic anemia patient is a Jehovah's Witness Hemoglobin is stable and at 7.8 Epogen weekly will be continued   8. Chronic hypertension  Stable  Continue Imdur, Coreg and Norvasc  DVT prophylaxis; heparin  I called and updated patient's daughter Mr. Tonna Boehringer on treatment plans as outlined above.  All questions were answered and he is in agreement with the plan of care as outlined above.  All the records are reviewed and case discussed with Care Management/Social Worker. Management plans discussed with the patient, and she is in agreement.  CODE STATUS: Full Code  TOTAL TIME TAKING CARE OF THIS PATIENT: 27 minutes.   More than 50% of the time was spent in counseling/coordination of care: YES  POSSIBLE D/C IN 2 DAYS, DEPENDING ON CLINICAL CONDITION.   Glendell Fouse M.D on  07/18/2018 at 3:56 PM  Between 7am to 6pm - Pager - [phone]  After 6pm go to [email or web address] - Proofreader  [facility] [place] [facility]  Office  [phone]  CC: Primary care physician; Juluis Pitch, MD  Note: This dictation was prepared with Dragon dictation along with smaller phrase technology. Any transcriptional errors that result from this process are unintentional.

## 2018-07-18 NOTE — Progress Notes (Signed)
Nutrition Brief Note  Patient identified on the Malnutrition Screening Tool (MST) Report  Wt Readings from Last 15 Encounters:  07/17/18 59 kg  07/01/18 82.9 kg  05/30/18 64.8 kg    Body mass index is 21.63 kg/m. Patient meets criteria for normal based on current BMI.   Current diet order is 2gm Na, patient is consuming approximately 0 % of meals 100% of 1 recorded ONS at this time. Labs and medications reviewed.   Consulted for nutrition education. Patient lethargic and inappropriate for education at RD visit, no family in room. Per note, patient will be [facility]uning to [facility] at [facility]. Will attempt to see patient before discharge.  Upon review of notes, patient received low Na diet education on 06/27/18 on previous admission.1  No nutrition interventions warranted at this time. If nutrition issues arise, please consult RD.   Lajuan Lines, RD, LDN  After Hours/Weekend Pager: [phone]

## 2018-07-18 NOTE — NC FL2 (Signed)
[place] LEVEL OF CARE SCREENING TOOL     IDENTIFICATION  Patient Name: Jenny [name] Birthdate: 09-27-1955 Sex: female Admission Date (Current Location): 07/17/2018  [place] and [place] Number:  Engineering geologist and Address:  [facility], [address], [place], [place] [address]      Provider Number: [id number]  Attending Physician Name and Address:  Otila Back, MD  Relative Name and Phone Number:  [name]   [phone] or sanford,micah Son   [phone]     Current Level of Care: Hospital Recommended Level of Care: [place] Prior Approval Number:    Date Approved/Denied:   PASRR Number: [id number]  Discharge Plan: SNF    Current Diagnoses: Patient Active Problem List   Diagnosis Date Noted  . Status post thoracentesis   . Respiratory failure ([place]) 06/26/2018  . Goals of care, counseling/discussion   . Palliative care by specialist   . DNR (do not resuscitate) discussion   . CHF (congestive heart failure) ([place]) 05/29/2018    Orientation RESPIRATION BLADDER Height & Weight     Self  O2(3L) Incontinent Weight: 130 lb (59 kg) Height:  5\' 5"  (165.1 cm)  BEHAVIORAL SYMPTOMS/MOOD NEUROLOGICAL BOWEL NUTRITION STATUS      Continent Diet(2g sodium diet)  AMBULATORY STATUS COMMUNICATION OF NEEDS Skin   Extensive Assist Verbally Normal                       Personal Care Assistance Level of Assistance  Feeding, Dressing, Bathing Bathing Assistance: Limited assistance Feeding assistance: Limited assistance Dressing Assistance: Limited assistance     Functional Limitations Info  Hearing, Speech, Sight Sight Info: Adequate Hearing Info: Adequate Speech Info: Adequate    SPECIAL CARE FACTORS FREQUENCY  PT (By licensed PT), OT (By licensed OT)     PT Frequency: 5x a week OT Frequency: 5x a week            Contractures Contractures Info: Not present    Additional Factors Info   Code Status, Allergies, Insulin Sliding Scale Code Status Info: Full Code Allergies Info: PENICILLINS    Insulin Sliding Scale Info: insulin aspart (novoLOG) injection 0-20 Units 3x a day with meals       Current Medications (07/18/2018):  This is the current hospital active medication list Current Facility-Administered Medications  Medication Dose Route Frequency Provider Last Rate Last Dose  . 0.9 %  sodium chloride infusion  250 mL Intravenous PRN Salary, Montell D, MD      . acetaminophen (TYLENOL) tablet 650 mg  650 mg Oral Q4H PRN Salary, Montell D, MD      . ALPRAZolam Duanne Moron) tablet 0.25 mg  0.25 mg Oral BID PRN Salary, Montell D, MD   0.25 mg at 07/18/18 1419  . amLODipine (NORVASC) tablet 2.5 mg  2.5 mg Oral Daily Salary, Montell D, MD   2.5 mg at 07/18/18 0849  . atorvastatin (LIPITOR) tablet 80 mg  80 mg Oral Daily Salary, Montell D, MD   80 mg at 07/18/18 0847  . carvedilol (COREG) tablet 25 mg  25 mg Oral BID Loney Hering D, MD   25 mg at 07/18/18 0850  . cholecalciferol (VITAMIN D) tablet 5,000 Units  5,000 Units Oral Daily Salary, Holly Bodily D, MD   5,000 Units at 07/18/18 0846  . doxycycline (VIBRAMYCIN) 100 mg in sodium chloride 0.9 % 250 mL IVPB  100 mg Intravenous Q12H Salary, Montell D, MD 125 mL/hr at  07/18/18 1055 100 mg at 07/18/18 1055  . epoetin alfa (EPOGEN,PROCRIT) injection 15,000 Units  15,000 Units Subcutaneous Q Benay Pike, Jude, MD   15,000 Units at 07/18/18 1051  . feeding supplement (PRO-STAT SUGAR FREE 64) liquid 30 mL  30 mL Oral BID Salary, Montell D, MD   30 mL at 07/18/18 1052  . furosemide (LASIX) injection 80 mg  80 mg Intravenous BID Loney Hering D, MD   80 mg at 07/18/18 1711  . guaiFENesin (MUCINEX) 12 hr tablet 600 mg  600 mg Oral BID Loney Hering D, MD   600 mg at 07/18/18 0848  . guaiFENesin (ROBITUSSIN) 100 MG/5ML solution 100 mg  5 mL Oral Q4H PRN Ojie, Jude, MD      . heparin injection 5,000 Units  5,000 Units Subcutaneous Q8H Salary,  Montell D, MD   5,000 Units at 07/18/18 1418  . insulin aspart (novoLOG) injection 0-20 Units  0-20 Units Subcutaneous TID WC Salary, Holly Bodily D, MD   11 Units at 07/18/18 1711  . insulin aspart (novoLOG) injection 0-5 Units  0-5 Units Subcutaneous QHS Loney Hering D, MD   5 Units at 07/17/18 2219  . insulin glargine (LANTUS) injection 10 Units  10 Units Subcutaneous QHS Ojie, Jude, MD      . ipratropium-albuterol (DUONEB) 0.5-2.5 (3) MG/3ML nebulizer solution 3 mL  3 mL Nebulization QID Salary, Montell D, MD   3 mL at 07/18/18 1522  . isosorbide mononitrate (IMDUR) 24 hr tablet 60 mg  60 mg Oral Daily Salary, Montell D, MD   60 mg at 07/18/18 0849  . loperamide (IMODIUM) capsule 2 mg  2 mg Oral Q6H PRN Salary, Montell D, MD      . methylPREDNISolone sodium succinate (SOLU-MEDROL) 125 mg/2 mL injection 60 mg  60 mg Intravenous Q6H Salary, Montell D, MD   60 mg at 07/18/18 1419  . metolazone (ZAROXOLYN) tablet 2.5 mg  2.5 mg Oral Daily Salary, Montell D, MD   2.5 mg at 07/18/18 0848  . multivitamin with minerals tablet 1 tablet  1 tablet Oral Daily Salary, Avel Peace, MD   1 tablet at 07/18/18 0849  . ondansetron (ZOFRAN) injection 4 mg  4 mg Intravenous Q6H PRN Salary, Montell D, MD      . polyethylene glycol (MIRALAX / GLYCOLAX) packet 17 g  17 g Oral Daily PRN Salary, Montell D, MD      . silver sulfADIAZINE (SILVADENE) 1 % cream 1 application  1 application Topical TID Salary, Avel Peace, MD   1 application at 07/62/26 1721  . sodium chloride flush (NS) 0.9 % injection 3 mL  3 mL Intravenous Q12H Salary, Montell D, MD   3 mL at 07/17/18 2220  . sodium chloride flush (NS) 0.9 % injection 3 mL  3 mL Intravenous PRN Salary, Montell D, MD      . vitamin C (ASCORBIC ACID) tablet 500 mg  500 mg Oral BID Loney Hering D, MD   500 mg at 07/18/18 3335     Discharge Medications: Please see discharge summary for a list of discharge medications.  Relevant Imaging Results:  Relevant Lab  Results:   Additional Information SSN [id number]  Ross Ludwig, [place]

## 2018-07-18 NOTE — Progress Notes (Signed)
Pt was upset and anxious, she said she was waiting in suspense for the results of the test ran on her. Chaplain offered a non-anxious presence and words of comfort. Pt declined at desire for AD at the time of this visit.    07/18/18 0600  Clinical Encounter Type  Visited With Patient  Visit Type Initial;Spiritual support;Other (Comment)  Referral From Nurse  Spiritual Encounters  Spiritual Needs Prayer;Emotional;Grief support;Other (Comment)  Stress Factors  Patient Stress Factors Exhausted;Lack of knowledge;Loss of control;Other (Comment)  Advance Directives (For Healthcare)  Does Patient Have a Medical Advance Directive? No  Would patient like information on creating a medical advance directive? No - Patient declined

## 2018-07-18 NOTE — Evaluation (Signed)
Physical Therapy Evaluation Patient Details Name: Jenny [name] MRN: [id number] DOB: 09-Mar-1956 Today's Date: 07/18/2018   History of Present Illness  63 year old female admitted for respiratory failure and SOB with 2L O2 chronically was desatting.  Has anemia but will not accept a transfusion (Jehovah Witness), did agree to thoracentisis.  Has pleural effusion from CHF.  PMHx:  anemia, CHF, DM, HTN, CKD stage 4.  She was here a few weeks ago, now returns from rehab.  Clinical Impression  Pt struggled to do very much with PT but was willing to try in her limited way.  She made some effort to get up to sitting EOB but ultimately needed a lot of assist and explanation and then very much struggled with multiple attempts at standing even with considerable assist.  She was very weak with strength testing in U&LEs and lacked full ROM, especially with AROM.  Pt unable to tolerate much and reports she has not done more than very minimal ambulation at rehab recently.      Follow Up Recommendations SNF    Equipment Recommendations  None recommended by PT    Recommendations for Other Services       Precautions / Restrictions Precautions Precautions: Fall Restrictions Weight Bearing Restrictions: No      Mobility  Bed Mobility Overal bed mobility: Needs Assistance Bed Mobility: Supine to Sit;Sit to Supine     Supine to sit: Mod assist;Max assist Sit to supine: Max assist   General bed mobility comments: Pt showed some minimal effort, but ultimately was very limited with what and how much she could do.  Transfers                 General transfer comment: Attempted to stand X 3 with elevated bed height.  Pt unable to give any effort to rise (despite stating that she understood and was trying) and with each attempt to get to standing she was unable to attain full upright and in reality was max assist to even try to clear bottom off bed surface.  Ambulation/Gait                 Stairs            Wheelchair Mobility    Modified Rankin (Stroke Patients Only)       Balance Overall balance assessment: Needs assistance Sitting-balance support: Feet supported Sitting balance-Leahy Scale: Fair Sitting balance - Comments: able to sit unsupported but requries supervision     Standing balance-Leahy Scale: Zero Standing balance comment: unable to attain fully upright standing                             Pertinent Vitals/Pain Pain Assessment: No/denies pain(though groans t/o the PT exam.)    Home Living Family/patient expects to be discharged to:: Skilled nursing facility                      Prior Function           Comments: Pt has not be able to do more than very minimal assisted walking for almost 2 months     Hand Dominance        Extremity/Trunk Assessment   Upper Extremity Assessment Upper Extremity Assessment: Generalized weakness(extremely weak, very limited AROM)    Lower Extremity Assessment Lower Extremity Assessment: Generalized weakness(extremely weak, very limited AROM)       Communication   Communication: No  difficulties  Cognition Arousal/Alertness: Lethargic Behavior During Therapy: Flat affect;Restless Overall Cognitive Status: Within Functional Limits for tasks assessed                                        General Comments      Exercises     Assessment/Plan    PT Assessment Patient needs continued PT services  PT Problem List Decreased range of motion;Decreased strength;Decreased balance;Decreased activity tolerance;Decreased mobility;Decreased coordination;Decreased knowledge of use of DME;Decreased safety awareness;Cardiopulmonary status limiting activity;Obesity       PT Treatment Interventions DME instruction;Gait training;Functional mobility training;Therapeutic activities;Therapeutic exercise;Balance training;Neuromuscular re-education;Patient/family  education    PT Goals (Current goals can be found in the Care Plan section)  Acute Rehab PT Goals Patient Stated Goal: to get stronger and return home PT Goal Formulation: With patient Time For Goal Achievement: 08/01/18 Potential to Achieve Goals: Good    Frequency Min 2X/week   Barriers to discharge        Co-evaluation               AM-PAC PT "6 Clicks" Mobility  Outcome Measure Help needed turning from your back to your side while in a flat bed without using bedrails?: A Lot Help needed moving from lying on your back to sitting on the side of a flat bed without using bedrails?: A Lot Help needed moving to and from a bed to a chair (including a wheelchair)?: Total Help needed standing up from a chair using your arms (e.g., wheelchair or bedside chair)?: Total Help needed to walk in hospital room?: Total Help needed climbing 3-5 steps with a railing? : Total 6 Click Score: 8    End of Session Equipment Utilized During Treatment: Gait belt;Oxygen Activity Tolerance: Patient limited by fatigue;Treatment limited secondary to medical complications (Comment) Patient left: in bed;with call bell/phone within reach;with bed alarm set Nurse Communication: Mobility status PT Visit Diagnosis: Unsteadiness on feet (R26.81);Muscle weakness (generalized) (M62.81);Difficulty in walking, not elsewhere classified (R26.2);Adult, failure to thrive (R62.7)    Time: 1572-6203 PT Time Calculation (min) (ACUTE ONLY): 27 min   Charges:   PT Evaluation $PT Eval Low Complexity: 1 Low PT Treatments $Therapeutic Activity: 8-22 mins        Kreg Shropshire, DPT 07/18/2018, 11:21 AM

## 2018-07-18 NOTE — Progress Notes (Addendum)
Inpatient Diabetes Program Recommendations  AACE/ADA: New Consensus Statement on Inpatient Glycemic Control (2015)  Target Ranges:  Prepandial:   less than 140 mg/dL      [facility] postprandial:   less than 180 mg/dL (1-2 hours)      Critically ill patients:  140 - 180 mg/dL   Results for ARTICE, HOLOHAN (MRN [id number]) as of 07/18/2018 08:56  Ref. Range 07/17/2018 18:11 07/17/2018 21:26 07/18/2018 08:05  Glucose-Capillary Latest Ref Range: 70 - 99 mg/dL 346 (H)  15 units NOVOLOG  387 (H)  5 units NOVOLOG  271 (H)  11 units NOVOLOG    Results for KENNEDEE, KITZMILLER (MRN [id number]) as of 07/18/2018 08:56  Ref. Range 05/29/2018 17:01  Hemoglobin A1C Latest Ref Range: 4.8 - 5.6 % 5.5    Admit with: Acute on chronic hypoxic respiratory failure secondary to multifactorial process that includes acute on chronic systolic congestive heart failure/COPD exacerbation, acute on chronic left pleural effusion  History: DM, CHF, CKD  Home DM Meds: None listed  Current Orders: Novolog Resistant Correction Scale/ SSI (0-20 units) TID AC + HS     Patient received 125 mg Solumedrol yesterday (02/13) at 3:30am and now Currently getting Solumedrol 60 mg Q6 hours.    CBGs likely elevated from steroids.    MD- Please consider the following in-hospital insulin adjustments while patient getting IV Steroids:  1. Start Lantus 10 units Daily--Please start this AM (~0.15 units/kg dosing based on weight of 59 kg)  2. Start Novolog Meal Coverage: Novolog 3 units TID with meals  (Please add the following Hold Parameters: Hold if pt eats <50% of meal, Hold if pt NPO)     --Will follow patient during hospitalization--  Wyn Quaker RN, MSN, CDE Diabetes Coordinator Inpatient Glycemic Control Team Team Pager: [phone] (8a-5p)

## 2018-07-18 NOTE — Clinical Social Work Note (Addendum)
Clinical Social Work Assessment  Patient Details  Name: Jenny [name] MRN: [id number] Date of Birth: 06/04/1956  Date of referral:  07/18/18               Reason for consult:  Facility Placement                 Permission sought to share information with:  Facility Sport and exercise psychologist Permission granted to share information::  Yes, Verbal Permission Granted             Name::     [name]   [phone] or sanford,micah Son   [phone]              Agency::  SNF admissions             Relationship::                Contact Information:     Housing/Transportation Living arrangements for the past 2 months:  [place] of Information:  Patient Patient Interpreter Needed:  None Criminal Activity/Legal Involvement Pertinent to Current Situation/Hospitalization:  No - Comment as needed Significant Relationships:  Adult Children Lives with:  Facility Resident, Self Do you feel safe going back to the place where you live?  Yes Need for family participation in patient care: Yes patient has some confusion  Care giving concerns:  Patient did not express any concerns about returning back to [facility] to continue with her therapy pending insurance authorization.  [name] would like her to return back to [facility].   Social Worker assessment / plan: Patient is a 63 year old female who is alert and oriented x1.  Patient has been at [facility] receiving some short term rehab.  Patient has some confusion, CSW spoke with her son he is in agreement to having patient return  [name] states he does not have any issues with the care that she is receiving.  [name] was informed that insurance approval will have to be started again.  Plan is for patient to return back to SNF, and has given CSW permission to send updated clinicals to SNF.  [name] did not have any other questions or concerns.  Employment status:   Therapist, music:  Managed Care PT Recommendations:  [place] / Referral to community resources:  [place]  Patient/Family's Response to care:  Patient and son are in agreement to returning back to SNF.  Patient/Family's Understanding of and Emotional Response to Diagnosis, Current Treatment, and Prognosis:  Patient and son are hopeful that she will not have to be in the hospital for very long so she can continue with her therapy.  Emotional Assessment Appearance:  Appears stated age Attitude/Demeanor/Rapport:    Affect (typically observed):    Orientation:  Oriented to Person Alcohol / Substance use:  Not Applicable Psych involvement (Current and /or in the community):  No (Comment)  Discharge Needs  Concerns to be addressed:  Lack of Support, Care Coordination Readmission within the last 30 days:  06/27/18 to [facility] Current discharge risk:  Lack of support system Barriers to Discharge:  No Barriers Identified, Continued Medical Work up

## 2018-07-18 NOTE — Progress Notes (Signed)
Palliative:   Jenny [name] is resting quietly in bed.  She will make and mostly keep eye contact but appears quite weak and frail.  There is no family at bedside at this time. She is able to make her basic needs known but appears subdued today.  She is oriented X 3.  Jenny [name] tells me that she would like to sit up in the bed,  She has a difficult time moving and frequently stops to rest.  We talk about thoracentesis from yesterday and 600 cc removed, results from testing pending.  We talk about returning to [facility] when she is stable.    We talk about Code status.  I ask  "if ... when your heart naturally stops", that we would "try " to restart is, a tube in her lung to breath for her, "life support".  Jenny [name] looks away and does not speak.  I allow long period of silence for thought and response.  She provides none.   I ask if there is anything that I can get for her, she pauses and states "fish",  that she would like fish for dinner.    Conference with nursing staff rt [place] discussion and patient request for fish dinner tonight.   48 minutes  Quinn Axe, NP  Palliative Medicine Team  Team Phone # [phone]  Greater than 50% of this time was spent counseling and coordinating care related to the above assessment and plan.

## 2018-07-19 LAB — COMPREHENSIVE METABOLIC PANEL
ALT: 36 U/L (ref 0–44)
AST: 18 U/L (ref 15–41)
Albumin: 1.7 g/dL — ABNORMAL LOW (ref 3.5–5.0)
Alkaline Phosphatase: 82 U/L (ref 38–126)
Anion gap: 12 (ref 5–15)
BUN: 112 mg/dL — ABNORMAL HIGH (ref 8–23)
CO2: 18 mmol/L — ABNORMAL LOW (ref 22–32)
Calcium: 6.7 mg/dL — ABNORMAL LOW (ref 8.9–10.3)
Chloride: 108 mmol/L (ref 98–111)
Creatinine, Ser: 3.62 mg/dL — ABNORMAL HIGH (ref 0.44–1.00)
GFR calc Af Amer: 15 mL/min — ABNORMAL LOW (ref >60)
GFR calc non Af Amer: 13 mL/min — ABNORMAL LOW (ref >60)
Glucose, Bld: 253 mg/dL — ABNORMAL HIGH (ref 70–99)
Potassium: 3.9 mmol/L (ref 3.5–5.1)
Sodium: 138 mmol/L (ref 135–145)
Total Bilirubin: 0.9 mg/dL (ref 0.3–1.2)
Total Protein: 5.6 g/dL — ABNORMAL LOW (ref 6.5–8.1)

## 2018-07-19 LAB — CBC
HEMATOCRIT: 26.2 % — AB (ref 36.0–46.0)
Hemoglobin: 7.5 g/dL — ABNORMAL LOW (ref 12.0–15.0)
MCH: 27.7 pg (ref 26.0–34.0)
MCHC: 28.6 g/dL — ABNORMAL LOW (ref 30.0–36.0)
MCV: 96.7 fL (ref 80.0–100.0)
Platelets: 209 10*3/uL (ref 150–400)
RBC: 2.71 MIL/uL — ABNORMAL LOW (ref 3.87–5.11)
RDW: 19.9 % — ABNORMAL HIGH (ref 11.5–15.5)
WBC: 12.1 10*3/uL — ABNORMAL HIGH (ref 4.0–10.5)
nRBC: 0 % (ref 0.0–0.2)

## 2018-07-19 LAB — MAGNESIUM: Magnesium: 1.8 mg/dL (ref 1.7–2.4)

## 2018-07-19 LAB — GLUCOSE, CAPILLARY
Glucose-Capillary: 209 mg/dL — ABNORMAL HIGH (ref 70–99)
Glucose-Capillary: 187 mg/dL — ABNORMAL HIGH (ref 70–99)
Glucose-Capillary: 166 mg/dL — ABNORMAL HIGH (ref 70–99)
Glucose-Capillary: 177 mg/dL — ABNORMAL HIGH (ref 70–99)

## 2018-07-19 LAB — LACTATE DEHYDROGENASE: LDH: 219 U/L — ABNORMAL HIGH (ref 98–192)

## 2018-07-19 MED ORDER — IPRATROPIUM-ALBUTEROL 0.5-2.5 (3) MG/3ML IN SOLN
3.0000 mL | Freq: Two times a day (BID) | RESPIRATORY_TRACT | Status: DC
  Administered 2018-07-19 – 2018-07-28 (×17): 3 mL via RESPIRATORY_TRACT
  Filled 2018-07-19 (×18): qty 3

## 2018-07-19 NOTE — Plan of Care (Signed)
  Problem: Elimination: Goal: Will not experience complications related to urinary retention Outcome: Progressing   Problem: Safety: Goal: Ability to remain free from injury will improve Outcome: Progressing   Problem: Skin Integrity: Goal: Risk for impaired skin integrity will decrease Outcome: Progressing   

## 2018-07-19 NOTE — Progress Notes (Signed)
[facility] at [facility] NAME: Jenny [name]    MR#:  [id number]  DATE OF BIRTH:  Dec 03, 1955  SUBJECTIVE:  CHIEF COMPLAINT: Shortness of breath  Patient has episodes of confusion but no overnight events reported.  This morning she appears confused.  Does not appear to be in any respiratory distress.  But appears pale REVIEW OF SYSTEMS:  Unable to obtain review of systems because of baseline confusion.  DRUG ALLERGIES:   Allergies  Allergen Reactions  . Penicillins Rash   VITALS:  Blood pressure 140/70, pulse 65, temperature (!) 95.8 F (35.4 C), temperature source Axillary, resp. rate 18, height 5\' 5"  (1.651 m), weight 78.5 kg, SpO2 100 %. PHYSICAL EXAMINATION:   GENERAL:  63 y.o.-year-old patient lying in the bed with no acute distress.  Malnourished appearing EYES: Pupils equal, round, reactive to light  No scleral icterus. Extraocular muscles intact.  She appears pale HEENT: Head atraumatic, normocephalic. Oropharynx and nasopharynx clear.  NECK:  Supple, no jugular venous distention. No thyroid enlargement, no tenderness.  LUNGS:  Diminished air entry bilaterally but no wheezing or rales. CARDIOVASCULAR: S1, S2 normal. No murmurs, rubs, or gallops.  ABDOMEN: Soft, nontender, nondistended. Bowel sounds present. No organomegaly or mass.  EXTREMITIES:  Decreased leg edema  nEUROLOGIC:  unable to do full neuro exam because she appears confused, no gross neurological deficit observed.   PSYCHIATRIC: The patient confused today.  According to nurse patient has episodes of confusion. SKIN: No obvious rash, lesion, or ulcer.   LABORATORY PANEL:  Female CBC Recent Labs  Lab 07/19/18 0427  WBC 12.1*  HGB 7.5*  HCT 26.2*  PLT 209   ------------------------------------------------------------------------------------------------------------------ Chemistries  Recent Labs  Lab 07/19/18 0427  NA 138  K 3.9  CL 108  CO2 18*  GLUCOSE  253*  BUN 112*  CREATININE 3.62*  CALCIUM 6.7*  MG 1.8  AST 18  ALT 36  ALKPHOS 82  BILITOT 0.9   RADIOLOGY:  No results found. ASSESSMENT AND PLAN:   63 year old female past medical history significant for chronic respiratory failure secondary to COPD, CHF on home oxygen, CKD stage IV, diabetes, hypertension, frequent hospitalizations for similar presentation, presents to hospital from [facility] secondary to recurrent respiratory failure  1.Acute on chronic hypoxic respiratory failure secondary to multifactorial process that includes acute on chronic systolic congestive heart failure/COPD exacerbation, acute on chronic left pleural effusion, and chronic severe anemia of chronic disease  Noted frequent hospitalizations for same, discharged approximately 2 weeks ago-had refused thoracentesis at that time, Jehovah's Witness-refuses blood transfusions Refer to the observation unit, supplemental oxygen wean as tolerated-on 2 L chronically at the nursing home  2. Acute on chronic systolic CHF exacerbation most recent echocardiogram with EF of 40 to 18% and diastolic dysfunction Congestive heart failure protocol, strict I&O monitoring, daily weights, IV Lasix twice daily, Zaroxolyn,  continue Coreg.  No ACE inhibitor due to underlying kidney disease  Continue IV diuretics for today, switch to orals tomorrow.  3. Acute on COPD exacerbation  IV Solu-Medrol with tapering as tolerated, aggressive pulmonary toilet with bronchodilator therapy, mucolytic agents, continue empiric doxycycline. Wean down the oxygen as O2 sats 100% on 2 L, decrease IV steroids.   4. Acute on chronic left pleural effusion  Moderate to large in size  Status post thoracocentesis on2/13,,, 600 mL of fluid removed.  5.  CKD stage IV At baseline e Avoid nephrotoxic agents, strict I&O monitoring, daily weights Patient follows with  [facility] nephrology Most recent renal ultrasound with increased echotexture of  bilateral kidneys  6. Chronic adult failure to thrive  Increase nursing care PRN, aspiration/fall/skin care precautions while in house, palliative care consulted given long-term poor prognosis   7. Chronic microcytic anemia patient is a Jehovah's Witness Hemoglobin is stable and at 7.7 Epogen weekly will be continued   8. Chronic hypertension, controlled  Continue Imdur, Coreg and Norvasc  DVT prophylaxis; heparin Discharge disposition: Back to [facility] nursing home when stable.   IAll the records are reviewed and case discussed with Care Management/Social Worker. Management plans discussed with the patient, and she is in agreement.  CODE STATUS: Full Code  TOTAL TIME TAKING CARE OF THIS PATIENT: 28 minutes.   More than 50% of the time was spent in counseling/coordination of care: YES  POSSIBLE D/C IN 2 DAYS, DEPENDING ON CLINICAL CONDITION.   Epifanio Lesches M.D on 07/19/2018 at 7:50 AM  Between 7am to 6pm - Pager - [phone]  After 6pm go to [email or web address] - Proofreader  [facility]  Office  [phone]  CC: Primary care physician; Juluis Pitch, MD  Note: This dictation was prepared with Dragon dictation along with smaller phrase technology. Any transcriptional errors that result from this process are unintentional.

## 2018-07-20 LAB — GLUCOSE, CAPILLARY
Glucose-Capillary: 105 mg/dL — ABNORMAL HIGH (ref 70–99)
Glucose-Capillary: 96 mg/dL (ref 70–99)
Glucose-Capillary: 150 mg/dL — ABNORMAL HIGH (ref 70–99)
Glucose-Capillary: 167 mg/dL — ABNORMAL HIGH (ref 70–99)

## 2018-07-20 MED ORDER — DOXYCYCLINE HYCLATE 100 MG PO TABS
100.0000 mg | ORAL_TABLET | Freq: Two times a day (BID) | ORAL | Status: DC
  Administered 2018-07-20 – 2018-07-21 (×2): 100 mg via ORAL
  Filled 2018-07-20 (×2): qty 1

## 2018-07-20 MED ORDER — FUROSEMIDE 40 MG PO TABS
80.0000 mg | ORAL_TABLET | Freq: Two times a day (BID) | ORAL | Status: DC
  Administered 2018-07-20 – 2018-07-22 (×4): 80 mg via ORAL
  Filled 2018-07-20 (×4): qty 2

## 2018-07-20 MED ORDER — OXYCODONE HCL 5 MG PO TABS
10.0000 mg | ORAL_TABLET | Freq: Once | ORAL | Status: AC
  Administered 2018-07-20: 10 mg via ORAL
  Filled 2018-07-20: qty 2

## 2018-07-20 NOTE — Progress Notes (Signed)
[place] at [place] NAME: Jenny [name]    MR#:  [id number]  DATE OF BIRTH:  1956/02/06  SUBJECTIVE:  CHIEF COMPLAINT: Shortness of breath  Required Percocet last night for pain in the back.  Patient noted to have cough but not oriented has baseline dementia. REVIEW OF SYSTEMS:  Unable to obtain review of systems because of baseline confusion.  DRUG ALLERGIES:   Allergies  Allergen Reactions  . Penicillins Rash   VITALS:  Blood pressure (!) 141/77, pulse 71, temperature 97.7 F (36.5 C), resp. rate 17, height 5\' 5"  (1.651 m), weight 77.7 kg, SpO2 100 %. PHYSICAL EXAMINATION:   GENERAL:  63 y.o.-year-old patient lying in the bed with no acute distress.  Malnourished appearing EYES: Pupils equal, round, reactive to light  No scleral icterus. Extraocular muscles intact.  She appears pale HEENT: Head atraumatic, normocephalic. Oropharynx and nasopharynx clear.  NECK:  Supple, no jugular venous distention. No thyroid enlargement, no tenderness.  LUNGS:  Diminished air entry bilaterally but no wheezing or rales. CARDIOVASCULAR: S1, S2 normal. No murmurs, rubs, or gallops.  ABDOMEN: Soft, nontender, nondistended. Bowel sounds present. No organomegaly or mass.  EXTREMITIES:  Decreased leg edema  nEUROLOGIC:  unable to do full neuro exam because she appears confused, no gross neurological deficit observed.   PSYCHIATRIC: The patient confused today.  According to nurse patient has episodes of confusion. SKIN: No obvious rash, lesion, or ulcer.   LABORATORY PANEL:  Female CBC Recent Labs  Lab 07/19/18 0427  WBC 12.1*  HGB 7.5*  HCT 26.2*  PLT 209   ------------------------------------------------------------------------------------------------------------------ Chemistries  Recent Labs  Lab 07/19/18 0427  NA 138  K 3.9  CL 108  CO2 18*  GLUCOSE 253*  BUN 112*  CREATININE 3.62*  CALCIUM 6.7*  MG 1.8  AST 18  ALT 36    ALKPHOS 82  BILITOT 0.9   RADIOLOGY:  No results found. ASSESSMENT AND PLAN:   63 year old female past medical history significant for chronic respiratory failure secondary to COPD, CHF on home oxygen, CKD stage IV, diabetes, hypertension, frequent hospitalizations for similar presentation, presents to hospital from [facility] secondary to recurrent respiratory failure  1.Acute on chronic hypoxic respiratory failure secondary to multifactorial process that includes acute on chronic systolic congestive heart failure/COPD exacerbation, acute on chronic left pleural effusion, and chronic severe anemia of chronic disease  Noted frequent hospitalizations for same, discharged approximately 2 weeks ago-had refused thoracentesis at that time, Jehovah's Witness-refuses blood transfusions  2. Acute on chronic systolic CHF exacerbation most recent echocardiogram with EF of 40 to 22% and diastolic dysfunction Congestive heart failure protocol, strict I&O monitoring, daily weights, IV Lasix twice daily, Zaroxolyn,  continue Coreg.  No ACE inhibitor due to underlying kidney disease, switch to oral diuretics today.  3. Acute on COPD exacerbation  IV Solu-Medrol with tapering as tolerated, aggressive pulmonary toilet with bronchodilator therapy, mucolytic agents, continue empiric doxycycline. Wean down the oxygen as O2 sats 100% on 2 L, decrease IV steroids.   4. Acute on chronic left pleural effusion  Moderate to large in size  Status post thoracocentesis on2/13,,, 600 mL of fluid removed.  5.  CKD stage IV At baseline e Avoid nephrotoxic agents, strict I&O monitoring, daily weights Patient follows with [facility] nephrology Most recent renal ultrasound with increased echotexture of bilateral kidneys  6. Chronic adult failure to thrive  Increase nursing care PRN, aspiration/fall/skin care precautions while in house, palliative care consulted  given long-term poor prognosis   7. Chronic  microcytic anemia patient is a Jehovah's Witness Hemoglobin is stable and at 7.7 Epogen weekly will be continued   8. Chronic hypertension, controlled  Continue Imdur, Coreg and Norvasc  DVT prophylaxis; heparin Discharge disposition: Back to [facility] nursing home when stable.   IAll the records are reviewed and case discussed with Care Management/Social Worker. Management plans discussed with the patient, and she is in agreement.  CODE STATUS: Full Code  TOTAL TIME TAKING CARE OF THIS PATIENT: 28 minutes.   More than 50% of the time was spent in counseling/coordination of care: YES  POSSIBLE D/C IN 2 DAYS, DEPENDING ON CLINICAL CONDITION.   Epifanio Lesches M.D on 07/20/2018 at 10:15 AM  Between 7am to 6pm - Pager - [phone]  After 6pm go to [email or web address] - Proofreader  [facility] [place]  Office  [phone]  CC: Primary care physician; Juluis Pitch, MD  Note: This dictation was prepared with Dragon dictation along with smaller phrase technology. Any transcriptional errors that result from this process are unintentional.

## 2018-07-21 LAB — BASIC METABOLIC PANEL
Anion gap: 14 (ref 5–15)
BUN: 131 mg/dL — ABNORMAL HIGH (ref 8–23)
CALCIUM: 6.9 mg/dL — AB (ref 8.9–10.3)
CO2: 18 mmol/L — ABNORMAL LOW (ref 22–32)
Chloride: 108 mmol/L (ref 98–111)
Creatinine, Ser: 3.85 mg/dL — ABNORMAL HIGH (ref 0.44–1.00)
GFR calc Af Amer: 14 mL/min — ABNORMAL LOW (ref >60)
GFR calc non Af Amer: 12 mL/min — ABNORMAL LOW (ref >60)
Glucose, Bld: 121 mg/dL — ABNORMAL HIGH (ref 70–99)
Potassium: 4.1 mmol/L (ref 3.5–5.1)
Sodium: 140 mmol/L (ref 135–145)

## 2018-07-21 LAB — GLUCOSE, CAPILLARY
GLUCOSE-CAPILLARY: 108 mg/dL — AB (ref 70–99)
Glucose-Capillary: 100 mg/dL — ABNORMAL HIGH (ref 70–99)
Glucose-Capillary: 108 mg/dL — ABNORMAL HIGH (ref 70–99)
Glucose-Capillary: 78 mg/dL (ref 70–99)

## 2018-07-21 MED ORDER — METHYLPREDNISOLONE SODIUM SUCC 40 MG IJ SOLR
40.0000 mg | Freq: Two times a day (BID) | INTRAMUSCULAR | Status: DC
  Administered 2018-07-21 – 2018-07-23 (×5): 40 mg via INTRAVENOUS
  Filled 2018-07-21 (×5): qty 1

## 2018-07-21 NOTE — Consult Note (Signed)
[facility] Nurse wound consult note Reason for Consult: Stage 1 pressure injury identified on admission, now resolved.  Patient continues to be at increased risk (moderate risk for pressure injury) Wound type:Pressure, moisture Pressure Injury POA: Yes, now resolved Measurement:N/A Wound bed:N/A Drainage (amount, consistency, odor) None Periwound:intact.  Blanchable erythema in the sacral area and heels Dressing procedure/placement/frequency:Patient is bring turned side to side and time in the supine position minimized.  I will provide bilateral heel pressure redistribution heel boots to prevent injury in these areas.  [place] nursing team will not follow, but will remain available to this patient, the nursing and medical teams.  Please re-consult if needed. Thanks, Maudie Flakes, MSN, RN, [place], Ar[facility] Abbott  Pager# [phone]

## 2018-07-21 NOTE — Progress Notes (Signed)
Cachexia

## 2018-07-21 NOTE — Progress Notes (Signed)
[place] [facility] [place] NAME: Jenny [name]    MR#:  [id number]  DATE OF BIRTH:  21-Mar-1956  SUBJECTIVE:  CHIEF COMPLAINT: Shortness of breath  Patient has bilateral leg edema, she is more alert, awake today than last 2 days., denies any chest pain or shortness of breath she knows that she is in the hospital.  Denies any complaints except shortness of breath that is improved from before. REVIEW OF SYSTEMS:  Review of systems still not possible because of overall mental acuity.  DRUG ALLERGIES:   Allergies  Allergen Reactions  . Penicillins Rash   VITALS:  Blood pressure 138/68, pulse 66, temperature 98 F (36.7 C), temperature source Oral, resp. rate 18, height 5\' 5"  (1.651 m), weight 77.7 kg, SpO2 100 %. PHYSICAL EXAMINATION:   GENERAL:  63 y.o.-year-old patient lying in the bed with no acute distress.  Malnourished appearing EYES: Pupils equal, round, reactive to light  No scleral icterus. Extraocular muscles intact.  She appears pale HEENT: Head atraumatic, normocephalic. Oropharynx and nasopharynx clear.  NECK:  Supple, no jugular venous distention. No thyroid enlargement, no tenderness.  LUNGS:  Diminished air entry bilaterally but no wheezing or rales. CARDIOVASCULAR: S1, S2 normal. No murmurs, rubs, or gallops.  ABDOMEN: Soft, nontender, nondistended. Bowel sounds present. No organomegaly or mass.  EXTREMITIES:  Decreased leg edema  nEUROLOGIC:  unable to do full neuro exam because she appears confused, no gross neurological deficit observed.   PSYCHIATRIC: The patient confused today.  According to nurse patient has episodes of confusion. SKIN: No obvious rash, lesion, or ulcer.   LABORATORY PANEL:  Female CBC Recent Labs  Lab 07/19/18 0427  WBC 12.1*  HGB 7.5*  HCT 26.2*  PLT 209   ------------------------------------------------------------------------------------------------------------------ Chemistries  Recent Labs    Lab 07/19/18 0427 07/21/18 0530  NA 138 140  K 3.9 4.1  CL 108 108  CO2 18* 18*  GLUCOSE 253* 121*  BUN 112* 131*  CREATININE 3.62* 3.85*  CALCIUM 6.7* 6.9*  MG 1.8  --   AST 18  --   ALT 36  --   ALKPHOS 82  --   BILITOT 0.9  --    RADIOLOGY:  No results found. ASSESSMENT AND PLAN:   63 year old female past medical history significant for chronic respiratory failure secondary to COPD, CHF on home oxygen, CKD stage IV, diabetes, hypertension, frequent hospitalizations for similar presentation, presents to hospital from [facility] secondary to recurrent respiratory failure  1.Acute on chronic hypoxic respiratory failure secondary to multifactorial process that includes acute on chronic systolic congestive heart failure/COPD exacerbation, acute on chronic left pleural effusion, and chronic severe anemia of chronic disease  Noted frequent hospitalizations for same, discharged approximately 2 weeks ago-had refused thoracentesis at that time, Jehovah's Witness-refuses blood transfusions  2. Acute on chronic systolic CHF exacerbation most recent echocardiogram with EF of 40 to 58% and diastolic dysfunction Congestive heart failure protocol, strict I&O monitoring, daily weights, IV Lasix twice daily, Zaroxolyn,  continue Coreg.  No ACE inhibitor due to underlying kidney disease, continue oral diuretic, still has mild leg edema so I would continue present treatment today also.  3. Acute on COPD exacerbation   currently improving, wean down the steroids, discontinue antibiotics.   4. Acute on chronic left pleural effusion  Moderate to large in size  Status post thoracocentesis on2/13,,, 600 mL of fluid removed.  5.  CKD stage IV At baseline e Avoid nephrotoxic agents, strict  I&O monitoring, daily weights Patient follows with [facility] nephrology Most recent renal ultrasound with increased echotexture of bilateral kidneys  6. Chronic adult failure to thrive  Increase nursing  care PRN, aspiration/fall/skin care precautions while in house, palliative care consulted given long-term poor prognosis   7. Chronic microcytic anemia patient is a Jehovah's Witness Hemoglobin is stable and at 7.7 Epogen weekly will be continued   8. Chronic hypertension, controlled  Continue Imdur, Coreg and Norvasc Plan to get physical therapy consult, requested palliative care input as well.  She looks more alert than last few days.  DVT prophylaxis; heparin Discharge disposition: Back to [facility] nursing home when stable.   IAll the records are reviewed and case discussed with Care Management/Social Worker. Management plans discussed with the patient, and she is in agreement.  CODE STATUS: Full Code  TOTAL TIME TAKING CARE OF THIS PATIENT: 28 minutes.   More than 50% of the time was spent in counseling/coordination of care: YES  POSSIBLE D/C IN 2 DAYS, DEPENDING ON CLINICAL CONDITION.   Epifanio Lesches M.D on 07/21/2018 at 12:31 PM  Between 7am to 6pm - Pager - [phone]  After 6pm go to [email or web address] - Proofreader  [facility]  Office  [phone]  CC: Primary care physician; Juluis Pitch, MD  Note: This dictation was prepared with Dragon dictation along with smaller phrase technology. Any transcriptional errors that result from this process are unintentional.

## 2018-07-22 ENCOUNTER — Inpatient Hospital Stay: Payer: BC Managed Care – PPO

## 2018-07-22 LAB — GLUCOSE, CAPILLARY
Glucose-Capillary: 82 mg/dL (ref 70–99)
Glucose-Capillary: 94 mg/dL (ref 70–99)
Glucose-Capillary: 102 mg/dL — ABNORMAL HIGH (ref 70–99)
Glucose-Capillary: 114 mg/dL — ABNORMAL HIGH (ref 70–99)

## 2018-07-22 LAB — BASIC METABOLIC PANEL
Anion gap: 11 (ref 5–15)
BUN: 136 mg/dL — AB (ref 8–23)
CO2: 19 mmol/L — ABNORMAL LOW (ref 22–32)
Calcium: 6.7 mg/dL — ABNORMAL LOW (ref 8.9–10.3)
Chloride: 110 mmol/L (ref 98–111)
Creatinine, Ser: 3.74 mg/dL — ABNORMAL HIGH (ref 0.44–1.00)
GFR calc Af Amer: 14 mL/min — ABNORMAL LOW (ref >60)
GFR calc non Af Amer: 12 mL/min — ABNORMAL LOW (ref >60)
Glucose, Bld: 98 mg/dL (ref 70–99)
Potassium: 4.3 mmol/L (ref 3.5–5.1)
Sodium: 140 mmol/L (ref 135–145)

## 2018-07-22 LAB — LACTIC ACID, PLASMA
Lactic Acid, Venous: 1.2 mmol/L (ref 0.5–1.9)
Lactic Acid, Venous: 1.1 mmol/L (ref 0.5–1.9)

## 2018-07-22 LAB — CBC
HEMATOCRIT: 29 % — AB (ref 36.0–46.0)
Hemoglobin: 8.7 g/dL — ABNORMAL LOW (ref 12.0–15.0)
MCH: 28.1 pg (ref 26.0–34.0)
MCHC: 30 g/dL (ref 30.0–36.0)
MCV: 93.5 fL (ref 80.0–100.0)
Platelets: 231 10*3/uL (ref 150–400)
RBC: 3.1 MIL/uL — ABNORMAL LOW (ref 3.87–5.11)
RDW: 20.3 % — ABNORMAL HIGH (ref 11.5–15.5)
WBC: 13.1 10*3/uL — ABNORMAL HIGH (ref 4.0–10.5)
nRBC: 0.4 % — ABNORMAL HIGH (ref 0.0–0.2)

## 2018-07-22 NOTE — Progress Notes (Signed)
Called Dr. About patient temp of 93.5. New order put in for bear hugger. Will continue to monitor.

## 2018-07-22 NOTE — Plan of Care (Signed)
  Problem: Clinical Measurements: Goal: Ability to maintain clinical measurements within normal limits will improve Outcome: Not Progressing Note:  Patient's BUN today is over 100 and increasing. Nephrologist has been consulted. Will continue to monitor renal function. Wenda Low [facility]

## 2018-07-22 NOTE — Progress Notes (Signed)
Physical Therapy Treatment Patient Details Name: Jenny [name] MRN: [id number] DOB: March 03, 1956 Today's Date: 07/22/2018    History of Present Illness 63 year old female admitted for respiratory failure and SOB with 2L O2 chronically was desatting.  Has anemia but will not accept a transfusion (Jehovah Witness), did agree to thoracentisis.  Has pleural effusion from CHF.  PMHx:  anemia, CHF, DM, HTN, CKD stage 4.  She was here a few weeks ago, now returns from rehab.    PT Comments    Pt appearing lethargic and in low spirits today.  She agreed to participate with therapy and attempt getting to EOB and supine there ex, but little effort was put in by pt.  Pt was max A to perform partial supine to sit and required assistance to reposition in bed.  Pt did perform minimal quad sets and PT performed PROM hip abduction.  Pt demonstrated good quad strength with quad sets but stopped performing;  Pt then began moaning and calling out, stating that she did not feel pain but just weakness.  She stated that she would try again this afternoon if someone would come by to re-attempt.  PT offered education regarding importance of active movement for the healing process and prevention of pressure ulcers.  Pt will benefit from skilled PT with focus on strength, tolerance to activity and safe functional mobility.  Follow Up Recommendations  SNF     Equipment Recommendations  None recommended by PT    Recommendations for Other Services       Precautions / Restrictions Precautions Precautions: Fall;Other (comment) Precaution Comments: O2 Restrictions Weight Bearing Restrictions: No    Mobility  Bed Mobility Overal bed mobility: Needs Assistance Bed Mobility: Supine to Sit;Sit to Supine     Supine to sit: Max assist Sit to supine: Max assist   General bed mobility comments: Pt achieved partial supine to sit with very little effort, did not attempt to move LE's at all when asked to move toward  EOB.  Transfers                 General transfer comment: Attempted to stand X 3 with elevated bed height.  Pt unable to give any effort to rise (despite stating that she understood and was trying) and with each attempt to get to standing she was unable to attain full upright and in reality was max assist to even try to clear bottom off bed surface.  Ambulation/Gait                 Stairs             Wheelchair Mobility    Modified Rankin (Stroke Patients Only)       Balance                                            Cognition Arousal/Alertness: Lethargic Behavior During Therapy: Restless Overall Cognitive Status: Within Functional Limits for tasks assessed                                 General Comments: Pt moaning and closing her eyes throughout treatment attempt      Exercises Other Exercises Other Exercises: Attempt at supine ther ex which pt agreed to do: Quad sets L: x3, R: x5, PROM hip abduction:  x5 bilaterally.  Pt then began moaning again and closed her eyes.  Stated she would try later today.    General Comments        Pertinent Vitals/Pain Pain Assessment: No/denies pain Pain Score: 0-No pain Pain Location: When asked if she felt pain, pt exclaimed, "NO!" and stated that she just felt weak.    Home Living                      Prior Function            PT Goals (current goals can now be found in the care plan section) Acute Rehab PT Goals Patient Stated Goal: to get stronger and return home PT Goal Formulation: With patient Time For Goal Achievement: 08/01/18 Potential to Achieve Goals: Good    Frequency    Min 2X/week      PT Plan Current plan remains appropriate    Co-evaluation              AM-PAC PT "6 Clicks" Mobility   Outcome Measure  Help needed turning from your back to your side while in a flat bed without using bedrails?: A Lot Help needed moving from lying  on your back to sitting on the side of a flat bed without using bedrails?: A Lot Help needed moving to and from a bed to a chair (including a wheelchair)?: Total Help needed standing up from a chair using your arms (e.g., wheelchair or bedside chair)?: Total Help needed to walk in hospital room?: Total Help needed climbing 3-5 steps with a railing? : Total 6 Click Score: 8    End of Session Equipment Utilized During Treatment: Oxygen Activity Tolerance: Patient limited by fatigue;Treatment limited secondary to medical complications (Comment) Patient left: in bed;with call bell/phone within reach;with bed alarm set Nurse Communication: Mobility status PT Visit Diagnosis: Unsteadiness on feet (R26.81);Muscle weakness (generalized) (M62.81);Difficulty in walking, not elsewhere classified (R26.2);Adult, failure to thrive (R62.7)     Time: 1044-1100 PT Time Calculation (min) (ACUTE ONLY): 16 min  Charges:  $Therapeutic Activity: 8-22 mins                     Roxanne Gates, PT, DPT    Roxanne Gates 07/22/2018, 11:12 AM

## 2018-07-22 NOTE — Progress Notes (Signed)
PT Cancellation Note  Patient Details Name: Jenny [name] MRN: [id number] DOB: [birth year]   Cancelled Treatment:    Reason Eval/Treat Not Completed: Patient declined, no reason specified.  Pt currently eating breakfast.  Stated that she would be willing to try to participate with therapy after she is finished.  Will return later.  Roxanne Gates, PT, DPT  Roxanne Gates 07/22/2018, 9:45 AM

## 2018-07-22 NOTE — Progress Notes (Signed)
Bilateral Prevalon boots applied at this time, as ordered continuously. They weren't on prior to this, apparently we were out of stock of this item. Will continue to monitor. Patient was appreciative. Wenda Low [facility]

## 2018-07-22 NOTE — Progress Notes (Signed)
[place] at [place] NAME: Talon Witting    MR#:  [id number]  DATE OF BIRTH:  Mar 09, 1956  SUBJECTIVE:  CHIEF COMPLAINT: Shortness of breath  Patient developed hypothermia last night, now.  Has  bairhugger on.  Nothing REVIEW OF SYSTEMS:  Review of systems still not possible because of overall mental acuity.  DRUG ALLERGIES:   Allergies  Allergen Reactions  . Penicillins Rash   VITALS:  Blood pressure (!) 145/69, pulse 63, temperature (!) 97.3 F (36.3 C), temperature source Oral, resp. rate 18, height 5\' 5"  (1.651 m), weight 76.2 kg, SpO2 100 %. PHYSICAL EXAMINATION:   GENERAL:  63 y.o.-year-old patient lying in the bed with no acute distress.  EYES: Pupils equal, round, reactive to light  No scleral icterus. Extraocular muscles intact.  She appears pale HEENT: Head atraumatic, normocephalic. Oropharynx and nasopharynx clear.  NECK:  Supple, no jugular venous distention. No thyroid enlargement, no tenderness.  LUNGS:  Diminished air entry bilaterally but no wheezing or rales. CARDIOVASCULAR: S1, S2 normal. No murmurs, rubs, or gallops.  ABDOMEN: Soft, nontender, nondistended. Bowel sounds present. No organomegaly or mass.  EXTREMITIES:  Decreased leg edema  nEUROLOGIC:  unable to do full neuro exam because she appears confused, no gross neurological deficit observed.   PSYCHIATRIC: The patient confused today.  According to nurse patient has episodes of confusion. SKIN: No obvious rash, lesion, or ulcer.   LABORATORY PANEL:  Female CBC Recent Labs  Lab 07/22/18 0446  WBC 13.1*  HGB 8.7*  HCT 29.0*  PLT 231   ------------------------------------------------------------------------------------------------------------------ Chemistries  Recent Labs  Lab 07/19/18 0427 07/21/18 0530  NA 138 140  K 3.9 4.1  CL 108 108  CO2 18* 18*  GLUCOSE 253* 121*  BUN 112* 131*  CREATININE 3.62* 3.85*  CALCIUM 6.7* 6.9*  MG 1.8  --     AST 18  --   ALT 36  --   ALKPHOS 82  --   BILITOT 0.9  --    RADIOLOGY:  No results found. ASSESSMENT AND PLAN:   63 year old female past medical history significant for chronic respiratory failure secondary to COPD, CHF on home oxygen, CKD stage IV, diabetes, hypertension, frequent hospitalizations for similar presentation, presents to hospital from [facility] secondary to recurrent respiratory failure  1.Acute on chronic hypoxic respiratory failure secondary to multifactorial process that includes acute on chronic systolic congestive heart failure/COPD exacerbation, acute on chronic left pleural effusion, and chronic severe anemia of chronic disease  Noted frequent hospitalizations for same, discharged approximately 2 weeks ago-had refused thoracentesis at that time, Jehovah's Witness-refuses blood transfusions  2. Acute on chronic systolic CHF exacerbation most recent echocardiogram with EF of 40 to 00% and diastolic dysfunction Congestive heart failure protocol, strict I&O monitoring, daily weights, hold diuretics for today as patient is hypothermic for possible impending infection.  Check lactic acid as well.  3. Acute on COPD exacerbation   currently improving, wean down the steroids, discontinue antibiotics.   4. Acute on chronic left pleural effusion  Moderate to large in size  Status post thoracocentesis on2/13,,, 600 mL of fluid removed.  5.  CKD stage IV At baseline e Avoid nephrotoxic agents, strict I&O monitoring, daily weights Patient follows with [facility] nephrology Most recent renal ultrasound with increased echotexture of bilateral kidneys  6. Chronic adult failure to thrive  Increase nursing care PRN, aspiration/fall/skin care precautions while in house, palliative care consulted given long-term poor prognosis  7. Chronic microcytic anemia patient is a Jehovah's Witness Hemoglobin is stable and at 7.7 Epogen weekly will be continued   8. Chronic  hypertension, controlled  Continue Imdur, Coreg and Norvasc #9/Hypothermia,..;continue bairhugger  On. check lactic acid, check portable chest x-ray, repeat blood cultures and urine cultures today.DVT prophylaxis; heparin Discharge disposition: Back to [facility] home when stable.   IAll the records are reviewed and case discussed with Care Management/Social Worker. Management plans discussed with the patient, and she is in agreement.  CODE STATUS: Full Code  TOTAL TIME TAKING CARE OF THIS PATIENT: 28 minutes.   More than 50% of the time was spent in counseling/coordination of care: YES  POSSIBLE D/C IN 2 DAYS, DEPENDING ON CLINICAL CONDITION.   Epifanio Lesches M.D on 07/22/2018 at 12:04 PM  Between 7am to 6pm - Pager - [phone]  After 6pm go to [email or web address] - Proofreader  [facility]  Office  [phone]  CC: Primary care physician; Juluis Pitch, MD  Note: This dictation was prepared with Dragon dictation along with smaller phrase technology. Any transcriptional errors that result from this process are unintentional.

## 2018-07-23 ENCOUNTER — Other Ambulatory Visit: Payer: Self-pay

## 2018-07-23 DIAGNOSIS — I509 Heart failure, unspecified: Secondary | ICD-10-CM

## 2018-07-23 DIAGNOSIS — J189 Pneumonia, unspecified organism: Secondary | ICD-10-CM

## 2018-07-23 DIAGNOSIS — N179 Acute kidney failure, unspecified: Secondary | ICD-10-CM

## 2018-07-23 DIAGNOSIS — Z7189 Other specified counseling: Secondary | ICD-10-CM

## 2018-07-23 DIAGNOSIS — Z515 Encounter for palliative care: Secondary | ICD-10-CM

## 2018-07-23 LAB — GLUCOSE, CAPILLARY
Glucose-Capillary: 88 mg/dL (ref 70–99)
Glucose-Capillary: 102 mg/dL — ABNORMAL HIGH (ref 70–99)
Glucose-Capillary: 96 mg/dL (ref 70–99)
Glucose-Capillary: 84 mg/dL (ref 70–99)
Glucose-Capillary: 82 mg/dL (ref 70–99)

## 2018-07-23 MED ORDER — ALBUMIN HUMAN 25 % IV SOLN
12.5000 g | Freq: Two times a day (BID) | INTRAVENOUS | Status: AC
  Administered 2018-07-23 – 2018-07-25 (×6): 12.5 g via INTRAVENOUS
  Filled 2018-07-23 (×7): qty 50

## 2018-07-23 MED ORDER — TRAZODONE HCL 50 MG PO TABS
50.0000 mg | ORAL_TABLET | Freq: Every evening | ORAL | Status: DC | PRN
  Administered 2018-07-23 – 2018-07-26 (×2): 50 mg via ORAL
  Filled 2018-07-23 (×2): qty 1

## 2018-07-23 MED ORDER — ENSURE ENLIVE PO LIQD
237.0000 mL | ORAL | Status: DC
  Administered 2018-07-24 – 2018-07-27 (×4): 237 mL via ORAL

## 2018-07-23 MED ORDER — LEVOFLOXACIN 500 MG PO TABS
500.0000 mg | ORAL_TABLET | ORAL | Status: AC
  Administered 2018-07-25 – 2018-07-27 (×2): 500 mg via ORAL
  Filled 2018-07-23 (×2): qty 1

## 2018-07-23 MED ORDER — FUROSEMIDE 10 MG/ML IJ SOLN
8.0000 mg/h | INTRAVENOUS | Status: DC
  Administered 2018-07-23: 8 mg/h via INTRAVENOUS
  Filled 2018-07-23: qty 25

## 2018-07-23 MED ORDER — LEVOFLOXACIN IN D5W 750 MG/150ML IV SOLN
750.0000 mg | Freq: Once | INTRAVENOUS | Status: AC
  Administered 2018-07-23: 750 mg via INTRAVENOUS
  Filled 2018-07-23: qty 150

## 2018-07-23 NOTE — Progress Notes (Signed)
Daily Progress Note   Patient Name: Jenny [name]       Date: 07/23/2018 DOB: 02-23-1956  Age: 63 y.o. MRN#: [id number] Attending Physician: Epifanio Lesches, MD Primary Care Physician: Juluis Pitch, MD Admit Date: 07/17/2018  Reason for Consultation/Follow-up: Establishing goals of care  Subjective: Patient is awake upon examination. Able to tell me she is in the hospital for COPD. Discussed further with her that she has pneumonia, heart failure and progressing acute on chronic kidney failure. In last 24 hours not able to maintain her core temp- worrisome for sepsis. She is adamant about her not wanting to have dialysis. She expresses concern that her children will have difficulty supporting her in this decision and will press her to have dialysis- I offered to facilitate a family discussion. At close of our conversation patient thanked me and was sitting up in bed eating spaghetti. Called her son Jenny [name], "TJ"- he lives in [place]- updated him on his mother's status. Reviewed her overall ongoing decline over last few months and her current acute decline. TJ stated he really was unaware of "how bad" her situation was. His expectation was that she would get some mobility back and would be able to return home. I reviewed with him the fact that she has had multiple hospitalizations, her very low albumin (1.7 on admission) her overall poor functional status and failure to improve during admission do make me concerned that she has little reserve to overcome this insult to her body.  He notes that he would not press her to have dialysis and he has had this conversation with her.  I further discussed with him that I am concerned about her surviving this hospitalization given her declining kidney  function and now hypothermia. I expressed a need to have a more in depth conversation regarding continued aggressive measures vs comfort measures and hospice. Also let him know that his mother's code status needs to be discussed. I preferred to discuss these with his Mom with him present either in person or by phone. He has a brother- Jenny [name] who lives locally. He is going to attempt to get in touch with Jenny [name] and arrange for Jenny [name] to be present and then Jenny [name] can be present via speakerphone.  ROS  Length of Stay: 5  Current Medications: Scheduled Meds:  . amLODipine  2.5  mg Oral Daily  . atorvastatin  80 mg Oral Daily  . carvedilol  25 mg Oral BID  . cholecalciferol  5,000 Units Oral Daily  . epoetin (EPOGEN/PROCRIT) injection  15,000 Units Subcutaneous Q Fri  . feeding supplement (ENSURE ENLIVE)  237 mL Oral Q24H  . guaiFENesin  600 mg Oral BID  . heparin  5,000 Units Subcutaneous Q8H  . insulin aspart  0-20 Units Subcutaneous TID WC  . insulin aspart  0-5 Units Subcutaneous QHS  . ipratropium-albuterol  3 mL Nebulization BID  . isosorbide mononitrate  60 mg Oral Daily  . [START ON 07/25/2018] levofloxacin  500 mg Oral Q48H  . methylPREDNISolone (SOLU-MEDROL) injection  40 mg Intravenous Q12H  . multivitamin with minerals  1 tablet Oral Daily  . silver sulfADIAZINE  1 application Topical TID  . sodium chloride flush  3 mL Intravenous Q12H  . ascorbic acid  500 mg Oral BID    Continuous Infusions: . sodium chloride 250 mL (07/19/18 2131)  . albumin human 12.5 mL/hr at 07/23/18 1352  . furosemide (LASIX) infusion 8 mg/hr (07/23/18 1502)  . levofloxacin (LEVAQUIN) IV      PRN Meds: sodium chloride, acetaminophen, ALPRAZolam, guaiFENesin, loperamide, ondansetron (ZOFRAN) IV, polyethylene glycol, sodium chloride flush  Physical Exam Vitals signs and nursing note reviewed.  Constitutional:      Appearance: She is ill-appearing.     Comments: anasarca  Cardiovascular:     Rate  and Rhythm: Tachycardia present.  Pulmonary:     Comments: Increased effort            Vital Signs: BP (!) 150/73 (BP Location: Right Arm)   Pulse 61   Temp (!) 96.6 F (35.9 C) (Other (Comment)) Comment (Src): bare hugger  Resp 20   Ht 5\' 5"  (1.651 m)   Wt 75.6 kg   SpO2 100%   BMI 27.72 kg/m  SpO2: SpO2: 100 % O2 Device: O2 Device: Nasal Cannula O2 Flow Rate: O2 Flow Rate (L/min): 2 L/min  Intake/output summary:   Intake/Output Summary (Last 24 hours) at 07/23/2018 1646 Last data filed at 07/23/2018 1502 Gross per 24 hour  Intake 39.38 ml  Output 1350 ml  Net -1310.62 ml   LBM: Last BM Date: 07/21/18 Baseline Weight: Weight: 59 kg Most recent weight: Weight: 75.6 kg       Palliative Assessment/Data: PPS: 30%   Flowsheet Rows     Most Recent Value  Intake Tab  Referral Department  Hospitalist  Unit at Time of Referral  Cardiac/Telemetry Unit  Palliative Care Primary Diagnosis  Cardiac  Date Notified  07/17/18  Palliative Care Type  Return patient Palliative Care  Reason for referral  Clarify Goals of Care  Date of Admission  07/17/18  Date first seen by Palliative Care  07/17/18  # of days Palliative referral response time  0 Day(s)  # of days IP prior to Palliative referral  0  Clinical Assessment  Palliative Performance Scale Score  40%  Pain Max last 24 hours  Not able to report  Pain Min Last 24 hours  Not able to report  Dyspnea Max Last 24 Hours  Not able to report  Dyspnea Min Last 24 hours  Not able to report  Psychosocial & Spiritual Assessment  Palliative Care Outcomes  Patient/Family meeting held?  Yes  Who was at the meeting?  patient at bedside.       Patient Active Problem List   Diagnosis Date Noted  . Status  post thoracentesis   . Respiratory failure ([place]) 06/26/2018  . Goals of care, counseling/discussion   . Palliative care by specialist   . DNR (do not resuscitate) discussion   . CHF (congestive heart failure) ([place]) 05/29/2018      Palliative Care Assessment & Plan   Patient Profile: 63 y.o. female  with past medical history of heart failure, stage IV kidney disease, diabetes, hypertension, anemia complicated by Jehovah's Witness, will not accept blood products admitted on 07/17/2018 with acute on chronic respiratory failure/ acute on chronic systolic CHF. PMT consulted dt poor long term prognosis.  Assessment/Recommendations/Plan   Patient initial Palliative consult completed 2/13 by Quinn Axe, NP  Continue current scope of care  No dialysis  Will discuss code status and other aggressive measures with patient and family tomorrow  Goals of Care and Additional Recommendations:  Limitations on Scope of Treatment: No Blood Transfusions and No Hemodialysis  Code Status:  Full code  Prognosis:   Unable to determine  Discharge Planning:  To Be Determined  Care plan was discussed with patient and her son.  Thank you for allowing the Palliative Medicine Team to assist in the care of this patient.   Time In: 1545 Time Out: 1645 Total Time 60 mins Prolonged Time Billed yes      Greater than 50%  of this time was spent counseling and coordinating care related to the above assessment and plan.  Mariana Kaufman, AGNP-C Palliative Medicine   Please contact Palliative Medicine Team phone at [phone] for questions and concerns.

## 2018-07-23 NOTE — Progress Notes (Addendum)
Initial Nutrition Assessment  DOCUMENTATION CODES:   Non-severe (moderate) malnutrition in context of chronic illness  INTERVENTION:   Ensure Enlive po daily, each supplement provides 350 kcal and 20 grams of protein  MVI daily   2g sodium diet   NUTRITION DIAGNOSIS:   Moderate Malnutrition related to chronic illness(CHF, COPD, CKD IV, FTT ) as evidenced by moderate fat depletion, moderate muscle depletion.  GOAL:   Patient will meet greater than or equal to 90% of their needs  MONITOR:   PO intake, Supplement acceptance, Labs, Weight trends, Skin, I & O's  REASON FOR ASSESSMENT:   LOS Diet education  ASSESSMENT:   63 year old female past medical history significant for chronic respiratory failure secondary to COPD,CHF on home oxygen, CKD stage IV, diabetes,hypertension,frequent hospitalizations for similar presentation,[facility] to recurrentrespiratory failure   Pt s/p thoracentesis 12/13 with 620m output  Met with pt in room today. Pt reports fair appetite and oral intake. Pt reports eating 75% of her breakfast today which included grits, a hard boiled egg and a fruit cup. Pt reports that she does like strawberry Ensure and would like to have this while in hospital. RD will only order Ensure for once daily as pt with AKI/CKD IV. Per chart, pt's UBW is around 130lbs. Pt currently up 36lbs from her UBW but is 7lbs down since admit. Pt at high risk for developing severe malnutrition. Unable to do full exam today r/t edema.   Medications reviewed and include: vitamin D, epoetin, heparin, insulin, solu-medrol, MVI, Vitamin C, albumin, lasix  Labs reviewed: BUN 136(H), creat 3.74(H), Ca 6.7(L) adj. 8.54(L), alb 1.7(L) BNP 1386(H)- 2/13 Wbc- 13.1(H), Hgb 8.7(L), Hct 29.0(L)  NUTRITION - FOCUSED PHYSICAL EXAM:    Most Recent Value  Orbital Region  Mild depletion  Upper Arm Region  Moderate depletion  Thoracic and Lumbar Region   Moderate depletion  Buccal Region  Moderate depletion  Temple Region  Moderate depletion  Clavicle Bone Region  Mild depletion  Clavicle and Acromion Bone Region  Mild depletion  Scapular Bone Region  Mild depletion  Dorsal Hand  Mild depletion  Patellar Region  Unable to assess  Anterior Thigh Region  Unable to assess  Posterior Calf Region  Unable to assess  Edema (RD Assessment)  Moderate  Hair  Reviewed  Eyes  Reviewed  Mouth  Reviewed  Skin  Reviewed  Nails  Reviewed     Diet Order:   Diet Order            Diet 2 gram sodium Room service appropriate? Yes; Fluid consistency: Thin  Diet effective now             EDUCATION NEEDS:   Education needs have been addressed  Skin:  Skin Assessment: Reviewed RN Assessment(per WOC note, sacral wound resolved)  Last BM:  2/18- TYPE 4  Height:   Ht Readings from Last 1 Encounters:  07/17/18 '5\' 5"'$  (1.651 m)    Weight:   Wt Readings from Last 1 Encounters:  07/23/18 75.6 kg    Ideal Body Weight:  56.8 kg  BMI:  Body mass index is 27.72 kg/m.  Estimated Nutritional Needs:   Kcal:  1500-1700kcal/day   Protein:  60-70g/day   Fluid:  1.4L/day or per MD  CKoleen DistanceMS, RD, LDN Pager #- [phone]#- [phone] Hours Pager: [phone]

## 2018-07-23 NOTE — Consult Note (Signed)
Pharmacy Antibiotic Note  Jenny [name] is a 63 y.o. female admitted on 07/17/2018 with pneumonia.  Pharmacy has been consulted for Levaquin dosing.  Plan: Levaquin 750mg  IV now, followed by 500mg  po q48 - dosing renally adjusted for pt with CrCl 15.7 ml/min not on HD.  Height: 5\' 5"  (165.1 cm) Weight: 166 lb 9.6 oz (75.6 kg) IBW/kg (Calculated) : 57  Temp (24hrs), Avg:94.8 F (34.9 C), Min:94.8 F (34.9 C), Max:94.8 F (34.9 C)  Recent Labs  Lab 07/17/18 0320 07/18/18 0437 07/19/18 0427 07/21/18 0530 07/22/18 0446 07/22/18 1224 07/22/18 1540  WBC 8.9 7.0 12.1*  --  13.1*  --   --   CREATININE 3.49* 3.55* 3.62* 3.85*  --  3.74*  --   LATICACIDVEN  --   --   --   --   --  1.2 1.1    Estimated Creatinine Clearance: 15.7 mL/min (A) (by C-G formula based on SCr of 3.74 mg/dL (H)).    Allergies  Allergen Reactions  . Penicillins Rash    Antimicrobials this admission: Doxycycline 2/13 >> 2/17 Levaquin 2/19 >>   Dose adjustments this admission: None  Microbiology results: Body Fluid Cx >> pending  Thank you for allowing pharmacy to be a part of this patient's care.  Lu Duffel, PharmD, BCPS Clinical Pharmacist 07/23/2018 1:00 PM

## 2018-07-23 NOTE — Plan of Care (Signed)
  Problem: Education: Goal: Knowledge of General Education information will improve Description Including pain rating scale, medication(s)/side effects and non-pharmacologic comfort measures Outcome: Progressing   Problem: Clinical Measurements: Goal: Ability to maintain clinical measurements within normal limits will improve Outcome: Progressing Goal: Will remain free from infection Outcome: Progressing Note:  Remains afebrile Goal: Cardiovascular complication will be avoided Outcome: Progressing Note:  No arrhythmias over night   Problem: Clinical Measurements: Goal: Diagnostic test results will improve Outcome: Not Progressing Note:  BUN 136/3.74, WBC 13.1, Ca 6.7 this morning Goal: Respiratory complications will improve Outcome: Not Progressing Note:  Remains congested, will start pt on Lasix gtt today

## 2018-07-23 NOTE — Progress Notes (Signed)
Notify Dr. Jannifer Franklin about patient's yelling out, unable to sleep asked for sleeping aid, order for trazodone given. RN will continue to monitor.

## 2018-07-23 NOTE — Progress Notes (Signed)
[facility] [place] NAME: Jenny [name]    MR#:  [id number]  DATE OF BIRTH:  1955-11-22  Patient is hypothermic, bear hugger on, chest x-ray is concerning for left base pneumonia.  Started on on Lasix drip per nephrology.  CHIEF COMPLAINT: Shortness of breath   REVIEW OF SYSTEMS:  Review of systems still not possible because of overall mental acuity.  DRUG ALLERGIES:   Allergies  Allergen Reactions  . Penicillins Rash   VITALS:  Blood pressure (!) 150/73, pulse 61, temperature (!) 94.8 F (34.9 C), temperature source Other (Comment), resp. rate 20, height 5\' 5"  (1.651 m), weight 75.6 kg, SpO2 100 %. PHYSICAL EXAMINATION:   GENERAL:  63 y.o.-year-old patient lying in the bed with no acute distress.  EYES: Pupils equal, round, reactive to light  No scleral icterus. Extraocular muscles intact.  She appears pale HEENT: Head atraumatic, normocephalic. Oropharynx and nasopharynx clear.  NECK:  Supple, no jugular venous distention. No thyroid enlargement, no tenderness.  LUNGS:  Diminished air entry bilaterally with basilar crepitations. CARDIOVASCULAR: S1, S2 normal. No murmurs, rubs, or gallops.  ABDOMEN: Soft, nontender, nondistended. Bowel sounds present. No organomegaly or mass.  EXTREMITIES:  Leg edema slightly worse. nEUROLOGIC:  unable to do full neuro exam because she appears confused, no gross neurological deficit observed.   PSYCHIATRIC: The patient confused today.  According to nurse patient has episodes of confusion. SKIN: No obvious rash, lesion, or ulcer.   LABORATORY PANEL:  Female CBC Recent Labs  Lab 07/22/18 0446  WBC 13.1*  HGB 8.7*  HCT 29.0*  PLT 231   ------------------------------------------------------------------------------------------------------------------ Chemistries  Recent Labs  Lab 07/19/18 0427  07/22/18 1224  NA 138   < > 140  K 3.9   < > 4.3  CL 108   < > 110  CO2 18*   < > 19*  GLUCOSE  253*   < > 98  BUN 112*   < > 136*  CREATININE 3.62*   < > 3.74*  CALCIUM 6.7*   < > 6.7*  MG 1.8  --   --   AST 18  --   --   ALT 36  --   --   ALKPHOS 82  --   --   BILITOT 0.9  --   --    < > = values in this interval not displayed.   RADIOLOGY:  Dg Chest Port 1 View  Result Date: 07/22/2018 CLINICAL DATA:  63 year old female with a history of pneumonia EXAM: PORTABLE CHEST 1 VIEW COMPARISON:  07/18/2018 FINDINGS: Cardiomediastinal silhouette unchanged in size and contour. Left basilar opacity obscures the left hemidiaphragm with blunting of left costophrenic angle. No pneumothorax. Interstitial opacities bilaterally. IMPRESSION: Increasing opacity at the left lung base compatible with pleural effusion and associated atelectasis/consolidation. Reticular opacities bilaterally may reflect atypical pneumonia and/or edema. Electronically Signed   By: Corrie Mckusick D.O.   On: 07/22/2018 14:14   ASSESSMENT AND PLAN:   63 year old female past medical history significant for chronic respiratory failure secondary to COPD, CHF on home oxygen, CKD stage IV, diabetes, hypertension, frequent hospitalizations for similar presentation, presents to hospital from [facility] secondary to recurrent respiratory failure  1.Acute on chronic hypoxic respiratory failure secondary to multifactorial process that includes acute on chronic systolic congestive heart failure/COPD exacerbation,/pneumonia acute on chronic left pleural effusion, and chronic severe anemia of chronic disease  Regarding pleural effusion patient had thoracocentesis on February 13, 600  mL of fluid drained, cultures were negative so far.  2.  cardioRenal syndrome, acute on chronic systolic heart failure, increase leg edema, patient started on Lasix drip per nephrology.  3. Acute on COPD exacerbation   currently improving ,  4. Acute on chronic left pleural effusion  Moderate to large in size  Status post thoracocentesis on2/13,,,  600 mL of fluid removed.  5.  CKD stage IV Worsening renal function with elevated BUN, seen by nephrology, started on Lasix drip.  Most recent renal ultrasound with increased echotexture of bilateral kidneys  6. Chronic adult failure to thrive  Increase nursing care PRN, aspiration/fall/skin care precautions while in house, palliative care consulted given long-term poor prognosis   7. Chronic microcytic anemia patient is a Jehovah's Witness Hemoglobin is stable and at 7.7 Epogen weekly will be continued   8. Chronic hypertension, controlled  Continue Imdur, Coreg and Norvasc #9/Hypothermia,..;continue bairhugger lactic acid not elevated.  Chest x-ray concerning for pneumonia so started on Zosyn.    Discharge disposition: Back to [facility] nursing home when stable. I still recommend palliative care consult, discuss CODE STATUS with family.  IAll the records are reviewed and case discussed with Care Management/Social Worker. Management plans discussed with the patient, and she is in agreement.  CODE STATUS: Full Code  TOTAL TIME TAKING CARE OF THIS PATIENT: 28 minutes.   More than 50% of the time was spent in counseling/coordination of care: YES  POSSIBLE D/C IN 2 DAYS, DEPENDING ON CLINICAL CONDITION.   Epifanio Lesches M.D on 07/23/2018 at 12:23 PM  Between 7am to 6pm - Pager - [phone]  After 6pm go to [email or web address] - Proofreader  [facility]  Office  [phone]  CC: Primary care physician; Juluis Pitch, MD  Note: This dictation was prepared with Dragon dictation along with smaller phrase technology. Any transcriptional errors that result from this process are unintentional.

## 2018-07-23 NOTE — Progress Notes (Signed)
[facility]  ROUNDING NOTE   Subjective:   Patient was readmitted from [facility] by EMS for difficulty with leg wounds.  Oxygen requirements were higher.  Patient denies any acute complaints today.  Leg edema appears to be worse. Patient was recently discharged from hospital on January 28.  She missed her appointment at the heart failure clinic on January 31. Creatinine at the time of discharge was 3.42/GFR 92 She presents with worsening creatinine and BUN.  Creatinine is now up to 3.74 and BUN is 136.  GFR 14.  Calcium is low at 6.7.  Nephrology consult has been requested for evaluation.  Albumin level is extremely low at 1.7.  Objective:  Vital signs in last 24 hours:  Temp:  [94.8 F (34.9 C)] 94.8 F (34.9 C) (02/19 0933) Pulse Rate:  [58-61] 61 (02/19 0737) Resp:  [17-20] 20 (02/19 0737) BP: (137-156)/(73-82) 150/73 (02/19 0737) SpO2:  [97 %-100 %] 100 % (02/19 0737) Weight:  [75.6 kg] 75.6 kg (02/19 0546)  Weight change: -0.635 kg Filed Weights   07/20/18 0322 07/22/18 0537 07/23/18 0546  Weight: 77.7 kg 76.2 kg 75.6 kg    Intake/Output: I/O last 3 completed shifts: In: -  Out: 2100 [Urine:2100]   Intake/Output this shift:  Total I/O In: 3 [I.V.:3] Out: -   Physical Exam: General: NAD,   Head: Normocephalic, atraumatic. Moist oral mucosal membranes  Eyes: Anicteric  Neck: Supple  Lungs:  Clear to auscultation  Heart: Regular rate and rhythm  Abdomen:  Soft, nontender,   Extremities:  Generalized anasarca  Neurologic:  Alert and able to answer questions appropriately   Skin:  hirsute        Basic Metabolic Panel: Recent Labs  Lab 07/17/18 0320 07/18/18 0437 07/19/18 0427 07/21/18 0530 07/22/18 1224  NA 139 139 138 140 140  K 4.5 4.1 3.9 4.1 4.3  CL 111 110 108 108 110  CO2 21* 19* 18* 18* 19*  GLUCOSE 246* 320* 253* 121* 98  BUN 103* 110* 112* 131* 136*  CREATININE 3.49* 3.55* 3.62* 3.85* 3.74*  CALCIUM 6.3* 6.6* 6.7* 6.9* 6.7*   MG  --  1.9 1.8  --   --     Liver Function Tests: Recent Labs  Lab 07/19/18 0427  AST 18  ALT 36  ALKPHOS 82  BILITOT 0.9  PROT 5.6*  ALBUMIN 1.7*   No results for input(s): LIPASE, AMYLASE in the last 168 hours. No results for input(s): AMMONIA in the last 168 hours.  CBC: Recent Labs  Lab 07/17/18 0320 07/18/18 0437 07/19/18 0427 07/22/18 0446  WBC 8.9 7.0 12.1* 13.1*  NEUTROABS 7.6  --   --   --   HGB 7.7* 7.8* 7.5* 8.7*  HCT 26.5* 25.9* 26.2* 29.0*  MCV 97.4 94.9 96.7 93.5  PLT 176 186 209 231    Cardiac Enzymes: Recent Labs  Lab 07/17/18 0320 07/17/18 1510 07/17/18 2104  TROPONINI 0.03* <0.03 0.04*    BNP: Invalid input(s): POCBNP  CBG: Recent Labs  Lab 07/22/18 0749 07/22/18 1130 07/22/18 1629 07/22/18 2100 07/23/18 0738  GLUCAP 82 94 102* 114* 51    Microbiology: Results for orders placed or performed during the hospital encounter of 06/26/18  MRSA PCR Screening     Status: None   Collection Time: 06/29/18  2:57 AM  Result Value Ref Range Status   MRSA by PCR NEGATIVE NEGATIVE Final    Comment:        The GeneXpert MRSA Assay (  FDA approved for NASAL specimens only), is one component of a comprehensive MRSA colonization surveillance program. It is not intended to diagnose MRSA infection nor to guide or monitor treatment for MRSA infections. Performed at [facility], [address] [place], [place] [address]     Coagulation Studies: No results for input(s): LABPROT, INR in the last 72 hours.  Urinalysis: No results for input(s): COLORURINE, LABSPEC, PHURINE, GLUCOSEU, HGBUR, BILIRUBINUR, KETONESUR, PROTEINUR, UROBILINOGEN, NITRITE, LEUKOCYTESUR in the last 72 hours.  Invalid input(s): APPERANCEUR    Imaging: Dg Chest Port 1 View  Result Date: 07/22/2018 CLINICAL DATA:  63 year old female with a history of pneumonia EXAM: PORTABLE CHEST 1 VIEW COMPARISON:  07/18/2018 FINDINGS: Cardiomediastinal silhouette  unchanged in size and contour. Left basilar opacity obscures the left hemidiaphragm with blunting of left costophrenic angle. No pneumothorax. Interstitial opacities bilaterally. IMPRESSION: Increasing opacity at the left lung base compatible with pleural effusion and associated atelectasis/consolidation. Reticular opacities bilaterally may reflect atypical pneumonia and/or edema. Electronically Signed   By: Corrie Mckusick D.O.   On: 07/22/2018 14:14     Medications:   . sodium chloride 250 mL (07/19/18 2131)   . amLODipine  2.5 mg Oral Daily  . atorvastatin  80 mg Oral Daily  . carvedilol  25 mg Oral BID  . cholecalciferol  5,000 Units Oral Daily  . epoetin (EPOGEN/PROCRIT) injection  15,000 Units Subcutaneous Q Fri  . feeding supplement (PRO-STAT SUGAR FREE 64)  30 mL Oral BID  . guaiFENesin  600 mg Oral BID  . heparin  5,000 Units Subcutaneous Q8H  . insulin aspart  0-20 Units Subcutaneous TID WC  . insulin aspart  0-5 Units Subcutaneous QHS  . ipratropium-albuterol  3 mL Nebulization BID  . isosorbide mononitrate  60 mg Oral Daily  . methylPREDNISolone (SOLU-MEDROL) injection  40 mg Intravenous Q12H  . multivitamin with minerals  1 tablet Oral Daily  . silver sulfADIAZINE  1 application Topical TID  . sodium chloride flush  3 mL Intravenous Q12H  . ascorbic acid  500 mg Oral BID   sodium chloride, acetaminophen, ALPRAZolam, guaiFENesin, loperamide, ondansetron (ZOFRAN) IV, polyethylene glycol, sodium chloride flush  Assessment/ Plan:  [name] is a 63 y.o. black female Jehovah  witness with hypertension, diabetes mellitus type II, congestive heart failure,  CAD/NSTEMI, MGUS, anemia , who was admitted to [facility] on 06/26/2018 for acute congestive heart failure  1. Acute renal failure  chronic kidney disease stage 4 Baseline creatinine of 2.64, GFR of 21. (Jun 05, 2018) Follows with Nephrologist at [facility].  Chronic kidney disease secondary to diabetes and hypertension.  Acute  renal failure secondary to acute cardiorenal syndrome  -Serum creatinine worsened to 3.74 -Avoid ACE inhibitor -Maintain blood pressure and avoid hypotension  2. Diabetes mellitus type II with chronic kidney disease: glucose controlled.  Hemoglobin A1c of 5.5%.   3.  Generalized edema Third spacing of fluid -Trial of IV albumin and IV Lasix drip to assist with diuresis    LOS: 5 Jenny [name] 2/19/202010:22 AM

## 2018-07-24 ENCOUNTER — Ambulatory Visit: Payer: BC Managed Care – PPO | Admitting: Family

## 2018-07-24 DIAGNOSIS — I5043 Acute on chronic combined systolic (congestive) and diastolic (congestive) heart failure: Secondary | ICD-10-CM

## 2018-07-24 LAB — GLUCOSE, CAPILLARY
GLUCOSE-CAPILLARY: 123 mg/dL — AB (ref 70–99)
Glucose-Capillary: 102 mg/dL — ABNORMAL HIGH (ref 70–99)
Glucose-Capillary: 122 mg/dL — ABNORMAL HIGH (ref 70–99)
Glucose-Capillary: 163 mg/dL — ABNORMAL HIGH (ref 70–99)
Glucose-Capillary: 247 mg/dL — ABNORMAL HIGH (ref 70–99)

## 2018-07-24 LAB — BASIC METABOLIC PANEL
Anion gap: 14 (ref 5–15)
BUN: 146 mg/dL — ABNORMAL HIGH (ref 8–23)
CALCIUM: 6.8 mg/dL — AB (ref 8.9–10.3)
CO2: 17 mmol/L — ABNORMAL LOW (ref 22–32)
Chloride: 110 mmol/L (ref 98–111)
Creatinine, Ser: 3.7 mg/dL — ABNORMAL HIGH (ref 0.44–1.00)
GFR calc Af Amer: 14 mL/min — ABNORMAL LOW (ref >60)
GFR calc non Af Amer: 12 mL/min — ABNORMAL LOW (ref >60)
Glucose, Bld: 105 mg/dL — ABNORMAL HIGH (ref 70–99)
Potassium: 4.9 mmol/L (ref 3.5–5.1)
Sodium: 141 mmol/L (ref 135–145)

## 2018-07-24 MED ORDER — SODIUM BICARBONATE 650 MG PO TABS: 650.0000 mg | ORAL_TABLET | Freq: Three times a day (TID) | ORAL | 0 refills | Status: DC

## 2018-07-24 MED ORDER — TRAZODONE HCL 50 MG PO TABS: 50.0000 mg | ORAL_TABLET | Freq: Every evening | ORAL | 0 refills | Status: DC | PRN

## 2018-07-24 MED ORDER — PREDNISONE 50 MG PO TABS
50.0000 mg | ORAL_TABLET | Freq: Every day | ORAL | Status: DC
  Administered 2018-07-24 – 2018-07-28 (×5): 50 mg via ORAL
  Filled 2018-07-24 (×5): qty 1

## 2018-07-24 MED ORDER — METOLAZONE 2.5 MG PO TABS
2.5000 mg | ORAL_TABLET | ORAL | Status: DC
  Filled 2018-07-24: qty 1

## 2018-07-24 MED ORDER — PREDNISONE 50 MG PO TABS: ORAL_TABLET | ORAL | 0 refills | Status: DC

## 2018-07-24 MED ORDER — METOLAZONE 2.5 MG PO TABS
2.5000 mg | ORAL_TABLET | Freq: Every day | ORAL | Status: DC
  Administered 2018-07-24 – 2018-07-28 (×5): 2.5 mg via ORAL
  Filled 2018-07-24 (×5): qty 1

## 2018-07-24 MED ORDER — AMLODIPINE BESYLATE 2.5 MG PO TABS: 2.5000 mg | ORAL_TABLET | Freq: Every day | ORAL | 0 refills | Status: DC

## 2018-07-24 MED ORDER — LEVOFLOXACIN 500 MG PO TABS: 500.0000 mg | ORAL_TABLET | ORAL | 0 refills | Status: DC

## 2018-07-24 MED ORDER — ENSURE ENLIVE PO LIQD: 237.0000 mL | ORAL | 12 refills | Status: DC

## 2018-07-24 MED ORDER — TORSEMIDE 20 MG PO TABS: 40.0000 mg | ORAL_TABLET | Freq: Every day | ORAL | Status: DC

## 2018-07-24 MED ORDER — BUMETANIDE 1 MG PO TABS
1.0000 mg | ORAL_TABLET | Freq: Two times a day (BID) | ORAL | Status: DC
  Administered 2018-07-24 – 2018-07-28 (×9): 1 mg via ORAL
  Filled 2018-07-24 (×10): qty 1

## 2018-07-24 MED ORDER — SODIUM BICARBONATE 650 MG PO TABS
650.0000 mg | ORAL_TABLET | Freq: Three times a day (TID) | ORAL | Status: DC
  Administered 2018-07-24 – 2018-07-28 (×12): 650 mg via ORAL
  Filled 2018-07-24 (×12): qty 1

## 2018-07-24 NOTE — Progress Notes (Signed)
Daily Progress Note   Patient Name: Jenny [name]       Date: 07/24/2018 DOB: 1955-12-01  Age: 63 y.o. MRN#: [id number] Attending Physician: Gorden Harms, MD Primary Care Physician: Juluis Pitch, MD Admit Date: 07/17/2018  Reason for Consultation/Follow-up: Establishing goals of care  Subjective: Patient in bed asleep. Arouses easily. She looks at the clock and says, "its almost time for lunch".  Noted plans for d/c back to rehab. Asked her how she feels about this. She states, "I do what I have to". She remains hopeful for some increased mobility. We discussed what her future without dialysis looks like. I expressed my concern that she will continue to decline in functional status. I discussed with her that given her low functional state, and that she is declining dialysis which is understandable, that we now need to start making a plan for how her future looks without dialysis. I talked to her about what it looks like to die from renal failure and natural dying process. We discussed code status and how CPR would interrupt what, for her, could be peace dying process. She states she had not thought about these things. She would like to consider these things and then speak with her family tomorrow.  I called her son, [name], and her sister, Bethena Roys and relayed above conversation. [name] and Bethena Roys were both supportive of patient's decisions. Discussed that although patient is stable for discharge now, they should prepare for the fact that patient is likely to have continued decline. Recommend ongoing discussions regarding advance care planning and [place]. [name] and Bethena Roys both live out of state. Patient has one son locally who lives in the home, but it does not sound as though he is a reliable support.    Review of Systems  Respiratory: Negative for shortness of breath.     Length of Stay: 6  Current Medications: Scheduled Meds:  . amLODipine  2.5 mg Oral Daily  . atorvastatin  80 mg Oral Daily  . bumetanide  1 mg Oral BID  . carvedilol  25 mg Oral BID  . cholecalciferol  5,000 Units Oral Daily  . epoetin (EPOGEN/PROCRIT) injection  15,000 Units Subcutaneous Q Fri  . feeding supplement (ENSURE ENLIVE)  237 mL Oral Q24H  . guaiFENesin  600 mg Oral BID  . heparin  5,000  Units Subcutaneous Q8H  . insulin aspart  0-20 Units Subcutaneous TID WC  . insulin aspart  0-5 Units Subcutaneous QHS  . ipratropium-albuterol  3 mL Nebulization BID  . isosorbide mononitrate  60 mg Oral Daily  . [START ON 07/25/2018] levofloxacin  500 mg Oral Q48H  . metolazone  2.5 mg Oral Daily  . multivitamin with minerals  1 tablet Oral Daily  . predniSONE  50 mg Oral Q breakfast  . silver sulfADIAZINE  1 application Topical TID  . sodium bicarbonate  650 mg Oral TID  . sodium chloride flush  3 mL Intravenous Q12H  . ascorbic acid  500 mg Oral BID    Continuous Infusions: . sodium chloride 250 mL (07/19/18 2131)  . albumin human 12.5 g (07/24/18 0954)    PRN Meds: sodium chloride, acetaminophen, ALPRAZolam, guaiFENesin, loperamide, ondansetron (ZOFRAN) IV, polyethylene glycol, sodium chloride flush, traZODone  Physical Exam          Vital Signs: BP (!) 148/65 (BP Location: Right Arm)   Pulse 68   Temp (!) 97.4 F (36.3 C) (Oral)   Resp 19   Ht 5\' 5"  (1.651 m)   Wt 72.3 kg   SpO2 98%   BMI 26.52 kg/m  SpO2: SpO2: 98 % O2 Device: O2 Device: Nasal Cannula O2 Flow Rate: O2 Flow Rate (L/min): 2 L/min  Intake/output summary:   Intake/Output Summary (Last 24 hours) at 07/24/2018 1220 Last data filed at 07/24/2018 0900 Gross per 24 hour  Intake 321.55 ml  Output 1250 ml  Net -928.45 ml   LBM: Last BM Date: 07/22/18 Baseline Weight: Weight: 59 kg Most recent weight: Weight: 72.3 kg        Palliative Assessment/Data: PPS: 30%    Flowsheet Rows     Most Recent Value  Intake Tab  Referral Department  Hospitalist  Unit at Time of Referral  Cardiac/Telemetry Unit  Palliative Care Primary Diagnosis  Cardiac  Date Notified  07/17/18  Palliative Care Type  Return patient Palliative Care  Reason for referral  Clarify Goals of Care  Date of Admission  07/17/18  Date first seen by Palliative Care  07/17/18  # of days Palliative referral response time  0 Day(s)  # of days IP prior to Palliative referral  0  Clinical Assessment  Palliative Performance Scale Score  40%  Pain Max last 24 hours  Not able to report  Pain Min Last 24 hours  Not able to report  Dyspnea Max Last 24 Hours  Not able to report  Dyspnea Min Last 24 hours  Not able to report  Psychosocial & Spiritual Assessment  Palliative Care Outcomes  Patient/Family meeting held?  Yes  Who was at the meeting?  patient at bedside.       Patient Active Problem List   Diagnosis Date Noted  . Pneumonia   . Acute renal failure ([place])   . Advanced care planning/counseling discussion   . Status post thoracentesis   . Respiratory failure ([place]) 06/26/2018  . Goals of care, counseling/discussion   . Palliative care by specialist   . DNR (do not resuscitate) discussion   . CHF (congestive heart failure) ([place]) 05/29/2018    Palliative Care Assessment & Plan   Patient Profile: 63 y.o.femalewith past medical history of heart failure, stage IV kidney disease, diabetes, hypertension, anemia complicated by Jehovah's Witness, will not accept blood productsadmitted on 2/13/2020with acute on chronic respiratory failure/ acute on chronic systolic CHF.PMT consulted dt poor long term  prognosis.  Assessment/Recommendations/Plan   Continue full scope care aside from no dialysis  Current plan is for patient to discharge to rehab facility in hopes of gaining some mobility  Patient with discharge orders in place  today  Recommend Palliative continue to follow outpatient at facility and continue [place] discussion  If patient is still admitted, I will see her again tomorrow  Goals of Care and Additional Recommendations:  Limitations on Scope of Treatment: No Hemodialysis  Code Status:  Full code  Prognosis:   Unable to determine  Discharge Planning:  [place] for rehab with Palliative care service follow-up  Care plan was discussed with patient, her son, and her sister.  Thank you for allowing the Palliative Medicine Team to assist in the care of this patient.   Time In: 1100 Time Out: 1200 Total Time 60 minutes Prolonged Time Billed yes      Greater than 50%  of this time was spent counseling and coordinating care related to the above assessment and plan.  Mariana Kaufman, AGNP-C Palliative Medicine   Please contact Palliative Medicine Team phone at [phone] for questions and concerns.

## 2018-07-24 NOTE — Discharge Summary (Signed)
[facility] [place] NAME: Jenny [name]    MR#:  [id number]  DATE OF BIRTH:  28-Nov-1955  DATE OF ADMISSION:  07/17/2018 ADMITTING PHYSICIAN: Gorden Harms, MD  DATE OF DISCHARGE: No discharge date for patient encounter.  PRIMARY CARE PHYSICIAN: Juluis Pitch, MD    ADMISSION DIAGNOSIS:  Hypocalcemia [E83.51] Shortness of breath [R06.02] Dyspnea [R06.00] Acute on chronic combined systolic and diastolic congestive heart failure (HCC) [I50.43] Acute respiratory failure with hypoxia (HCC) [J96.01] Acute on chronic congestive heart failure, unspecified heart failure type ([place]) [I50.9]  DISCHARGE DIAGNOSIS:  Active Problems:   Respiratory failure (HCC)   Status post thoracentesis   Pneumonia   Acute renal failure ([place])   Advanced care planning/counseling discussion   SECONDARY DIAGNOSIS:   Past Medical History:  Diagnosis Date  . Anemia   . CHF (congestive heart failure) ([place])   . CKD (chronic kidney disease) stage 4, GFR 15-29 ml/min (HCC)   . Diabetes mellitus without complication ([place])   . Hypertension   . Patient is [place] COURSE:  63 year old female past medical history significant for chronic respiratory failure secondary to COPD,CHF on home oxygen, CKD stage IV, diabetes,hypertension,frequent hospitalizations for similar presentation,[facility]  *Acute on chronic hypoxic respiratory failure Resolved secondary tomultifactorial process that includes acute on chronic systolic congestive heart failure/COPDexacerbation,/pneumonia acute on chronicleft pleural effusion, and chronic severe anemia of chronic disease  Regarding pleural effusion patient had thoracocentesis on February 13, 600 mL of fluid drained, cultures were negative, treated with supplemental oxygen-on chronic 2 L via nasal cannula at  home  *cardioRenal syndrome, acute on chronic systolic heart failure Tenous state in discussion with nephrology Was treated with heparin drip while in house, change back to Bumex 1 mg twice daily, Zaroxolyn 2.5 mg daily Patient is not interested in hemodialysis  * Acute on COPD exacerbation  Resolved Treated with IV Solu-Medrol, aggressive pulmonary toilet bronchodilator therapy, mucolytic agents  *Acute on chronic left pleural effusion  Resolved Moderate to large in size  Status post thoracocentesis on 2/13, 600 mL of fluid removed.  *CKD stage IV Worsening renal function with elevated BUN, seen by nephrology-treated with Lasix drip, patient not interested in hemodialysis, converted to p.o. Bumex and Zaroxolyn,  Most recentrenal ultrasound with increased echotexture of bilateral kidneys  *Chronic adult failure to thrive  Increased nursing care diet PRN, aspiration/fall/skin care precautions while in house, palliative care consulted given long-term poor prognosis  *Chronic microcytic anemia patient is a Jehovah's Witness Hemoglobin is stable  Epogenweekly will be continued  *Chronic hypertension controlled ContinueImdur, Coreg and Norvasc  *Hypothermia Resolved Chest x-ray concerning for pneumonia  Levaquin for short course   DISCHARGE CONDITIONS:   stable  CONSULTS OBTAINED:  Treatment Team:  Lavonia Dana, MD  DRUG ALLERGIES:   Allergies  Allergen Reactions  . Penicillins Rash    DISCHARGE MEDICATIONS:   Allergies as of 07/24/2018      Reactions   Penicillins Rash      Medication List    TAKE these medications   acetaminophen 325 MG tablet Commonly known as:  TYLENOL Take 2 tablets (650 mg total) by mouth every 6 (six) hours as needed for mild pain (or Fever >/= 101).   amLODipine 2.5 MG tablet Commonly known as:  NORVASC Take 1 tablet (2.5 mg total) by mouth daily. What changed:    medication strength  how  much to take    ascorbic acid 250 MG tablet Commonly known as:  VITAMIN C Take 500 mg by mouth 2 (two) times daily.   atorvastatin 80 MG tablet Commonly known as:  LIPITOR Take 80 mg by mouth daily.   bumetanide 1 MG tablet Commonly known as:  BUMEX Take 1 tablet (1 mg total) by mouth 2 (two) times daily.   carvedilol 25 MG tablet Commonly known as:  COREG Take 1 tablet by mouth 2 (two) times daily.   dextromethorphan-guaiFENesin 30-600 MG 12hr tablet Commonly known as:  MUCINEX DM Take 1 tablet by mouth 2 (two) times daily as needed for cough.   epoetin alfa-epbx 10000 UNIT/ML injection Commonly known as:  RETACRIT 15,000 Units every Friday.   feeding supplement (ENSURE ENLIVE) Liqd Take 237 mLs by mouth daily.   feeding supplement (PRO-STAT SUGAR FREE 64) Liqd Take 30 mLs by mouth 2 (two) times daily.   ipratropium-albuterol 0.5-2.5 (3) MG/3ML Soln Commonly known as:  DUONEB Take 3 mLs by nebulization every 6 (six) hours as needed (wheezing, shortness of breath). What changed:  when to take this   isosorbide mononitrate 60 MG 24 hr tablet Commonly known as:  IMDUR Take 1 tablet (60 mg total) by mouth daily.   levofloxacin 500 MG tablet Commonly known as:  LEVAQUIN Take 1 tablet (500 mg total) by mouth every other day. Start taking on:  July 25, 2018   loperamide 2 MG capsule Commonly known as:  IMODIUM Take 2 mg by mouth every 6 (six) hours as needed for diarrhea or loose stools.   metolazone 2.5 MG tablet Commonly known as:  ZAROXOLYN Take 2.5 mg by mouth daily.   multivitamin tablet Take 1 tablet by mouth daily.   polyethylene glycol packet Commonly known as:  MIRALAX / GLYCOLAX Take 17 g by mouth daily as needed for mild constipation.   silver sulfADIAZINE 1 % cream Commonly known as:  SILVADENE Apply 1 application topically 3 (three) times daily. Apply to sacrum and bilateral buttocks.   sodium bicarbonate 650 MG tablet Take 1 tablet (650 mg total) by mouth  3 (three) times daily.   traZODone 50 MG tablet Commonly known as:  DESYREL Take 1 tablet (50 mg total) by mouth at bedtime as needed for sleep.   Vitamin D3 125 MCG (5000 UT) Caps Take 5,000 Units by mouth daily.        DISCHARGE INSTRUCTIONS:   If you experience worsening of your admission symptoms, develop shortness of breath, life threatening emergency, suicidal or homicidal thoughts you must seek medical attention immediately by calling 911 or calling your MD immediately  if symptoms less severe.  You Must read complete instructions/literature along with all the possible adverse reactions/side effects for all the Medicines you take and that have been prescribed to you. Take any new Medicines after you have completely understood and accept all the possible adverse reactions/side effects.   Please note  You were cared for by a hospitalist during your hospital stay. If you have any questions about your discharge medications or the care you received while you were in the hospital after you are discharged, you can call the unit and asked to speak with the hospitalist on call if the hospitalist that took care of you is not available. Once you are discharged, your primary care physician will handle any further medical issues. Please note that NO REFILLS for any discharge medications will be authorized once you are discharged, as it is imperative  that you return to your primary care physician (or establish a relationship with a primary care physician if you do not have one) for your aftercare needs so that they can reassess your need for medications and monitor your lab values.    Today   CHIEF COMPLAINT:  No chief complaint on file.   HISTORY OF PRESENT ILLNESS:  63 y.o. female with a known history per below, presenting from local nursing home with acute shortness of breath, on 2 L chronically, recent hospital discharge approximately 2 weeks ago for respiratory failure due to COPD/heart  failure/chronic left pleural effusion-patient refused thoracentesis at that time, brought to the emergency room via EMS for recurrent shortness of breath, noted O2 saturation into the 80s, had been short of breath for the last 24 hours, dyspnea with exertion/activity, noted worsening swelling in the legs, ER work-up noted for BNP greater than 1300, hemoglobin 7.7-patient is Jehovah's Witness, chest x-ray noted for edema/persistent left moderate-sized pleural effusion, patient evaluated in the emergency room, no apparent distress, resting comfortably in bed, patient is now been admitted for acute on chronic hypoxic respiratory failure suspected secondary to acute on chronic systolic congestive heart failure, acute on chronic left-sided moderate pleural effusion, and COPD.   VITAL SIGNS:  Blood pressure (!) 148/65, pulse 68, temperature (!) 97.4 F (36.3 C), temperature source Oral, resp. rate 19, height 5\' 5"  (1.651 m), weight 72.3 kg, SpO2 98 %.  I/O:    Intake/Output Summary (Last 24 hours) at 07/24/2018 0926 Last data filed at 07/24/2018 0416 Gross per 24 hour  Intake 204.55 ml  Output 2050 ml  Net -1845.45 ml    PHYSICAL EXAMINATION:  GENERAL:  63 y.o.-year-old patient lying in the bed with no acute distress.  EYES: Pupils equal, round, reactive to light and accommodation. No scleral icterus. Extraocular muscles intact.  HEENT: Head atraumatic, normocephalic. Oropharynx and nasopharynx clear.  NECK:  Supple, no jugular venous distention. No thyroid enlargement, no tenderness.  LUNGS: Normal breath sounds bilaterally, no wheezing, rales,rhonchi or crepitation. No use of accessory muscles of respiration.  CARDIOVASCULAR: S1, S2 normal. No murmurs, rubs, or gallops.  ABDOMEN: Soft, non-tender, non-distended. Bowel sounds present. No organomegaly or mass.  EXTREMITIES: No pedal edema, cyanosis, or clubbing.  NEUROLOGIC: Cranial nerves II through XII are intact. Muscle strength 5/5 in all  extremities. Sensation intact. Gait not checked.  PSYCHIATRIC: The patient is alert and oriented x 3.  SKIN: No obvious rash, lesion, or ulcer.   DATA REVIEW:   CBC Recent Labs  Lab 07/22/18 0446  WBC 13.1*  HGB 8.7*  HCT 29.0*  PLT 231    Chemistries  Recent Labs  Lab 07/19/18 0427  07/24/18 0438  NA 138   < > 141  K 3.9   < > 4.9  CL 108   < > 110  CO2 18*   < > 17*  GLUCOSE 253*   < > 105*  BUN 112*   < > 146*  CREATININE 3.62*   < > 3.70*  CALCIUM 6.7*   < > 6.8*  MG 1.8  --   --   AST 18  --   --   ALT 36  --   --   ALKPHOS 82  --   --   BILITOT 0.9  --   --    < > = values in this interval not displayed.    Cardiac Enzymes Recent Labs  Lab 07/17/18 2104  TROPONINI 0.04*  Microbiology Results  Results for orders placed or performed during the hospital encounter of 06/26/18  MRSA PCR Screening     Status: None   Collection Time: 06/29/18  2:57 AM  Result Value Ref Range Status   MRSA by PCR NEGATIVE NEGATIVE Final    Comment:        The GeneXpert MRSA Assay (FDA approved for NASAL specimens only), is one component of a comprehensive MRSA colonization surveillance program. It is not intended to diagnose MRSA infection nor to guide or monitor treatment for MRSA infections. Performed at [facility], [address] [place], [place] [address]     RADIOLOGY:  Dg Chest Port 1 View  Result Date: 07/22/2018 CLINICAL DATA:  63 year old female with a history of pneumonia EXAM: PORTABLE CHEST 1 VIEW COMPARISON:  07/18/2018 FINDINGS: Cardiomediastinal silhouette unchanged in size and contour. Left basilar opacity obscures the left hemidiaphragm with blunting of left costophrenic angle. No pneumothorax. Interstitial opacities bilaterally. IMPRESSION: Increasing opacity at the left lung base compatible with pleural effusion and associated atelectasis/consolidation. Reticular opacities bilaterally may reflect atypical pneumonia and/or edema.  Electronically Signed   By: Corrie Mckusick D.O.   On: 07/22/2018 14:14    EKG:   Orders placed or performed during the hospital encounter of 07/17/18  . ED EKG  . ED EKG  . EKG 12-Lead  . EKG 12-Lead      Management plans discussed with the patient, family and they are in agreement.  CODE STATUS:     Code Status Orders  (From admission, onward)         Start     Ordered   07/17/18 0810  Full code  Continuous     07/17/18 0809        Code Status History    Date Active Date Inactive Code Status Order ID Comments User Context   06/26/2018 2215 07/01/2018 2210 Full Code [id number]  Gorden Harms, MD Inpatient   05/29/2018 2023 05/30/2018 2034 Full Code [id number]  Hillary Bow, MD ED      TOTAL TIME TAKING CARE OF THIS PATIENT: 40 minutes.    Jenny [name]  M.D on 07/24/2018 at 9:26 AM  Between 7am to 6pm - Pager - [phone]  After 6pm go to [email or web address] - password [email or web address] [facility]  Office  [phone]  CC: Primary care physician; Juluis Pitch, MD   Note: This dictation was prepared with Dragon dictation along with smaller phrase technology. Any transcriptional errors that result from this process are unintentional.

## 2018-07-24 NOTE — Clinical Social Work Note (Addendum)
CSW awaiting insurance approval for patient, and also discussion with palliative and her family.  CSW continuing to follow patient's progress throughout discharge planning.  3:25pm  [facility] is not able to accept patient back, CSW spoke to patient's son to discuss which facilities are able to accept patient pending insurance authorization.  [name] is going to review and discuss with his family members.  CSW informed him that patient is ready for discharge, and CSW will need a decision, in order to determine discharge venue and complete insurance auth.  Jenny [name]. Arletha Marschke, MSW, [place]  07/24/2018 12:10 PM

## 2018-07-25 DIAGNOSIS — Z5320 Procedure and treatment not carried out because of patient's decision for unspecified reasons: Secondary | ICD-10-CM

## 2018-07-25 DIAGNOSIS — E440 Moderate protein-calorie malnutrition: Secondary | ICD-10-CM

## 2018-07-25 DIAGNOSIS — R0602 Shortness of breath: Secondary | ICD-10-CM

## 2018-07-25 DIAGNOSIS — J90 Pleural effusion, not elsewhere classified: Secondary | ICD-10-CM

## 2018-07-25 DIAGNOSIS — Z5329 Procedure and treatment not carried out because of patient's decision for other reasons: Secondary | ICD-10-CM

## 2018-07-25 LAB — GLUCOSE, CAPILLARY
GLUCOSE-CAPILLARY: 147 mg/dL — AB (ref 70–99)
Glucose-Capillary: 145 mg/dL — ABNORMAL HIGH (ref 70–99)
Glucose-Capillary: 165 mg/dL — ABNORMAL HIGH (ref 70–99)
Glucose-Capillary: 199 mg/dL — ABNORMAL HIGH (ref 70–99)

## 2018-07-25 LAB — CBC
HCT: 25.3 % — ABNORMAL LOW (ref 36.0–46.0)
Hemoglobin: 7.8 g/dL — ABNORMAL LOW (ref 12.0–15.0)
MCH: 28.7 pg (ref 26.0–34.0)
MCHC: 30.8 g/dL (ref 30.0–36.0)
MCV: 93 fL (ref 80.0–100.0)
Platelets: 263 10*3/uL (ref 150–400)
RBC: 2.72 MIL/uL — ABNORMAL LOW (ref 3.87–5.11)
RDW: 21.4 % — ABNORMAL HIGH (ref 11.5–15.5)
WBC: 19.2 10*3/uL — ABNORMAL HIGH (ref 4.0–10.5)
nRBC: 0.7 % — ABNORMAL HIGH (ref 0.0–0.2)

## 2018-07-25 MED ORDER — CARVEDILOL 25 MG PO TABS
38.1250 mg | ORAL_TABLET | Freq: Two times a day (BID) | ORAL | Status: DC
  Administered 2018-07-25 – 2018-07-28 (×7): 37.5 mg via ORAL
  Filled 2018-07-25 (×7): qty 2

## 2018-07-25 MED ORDER — EPOETIN ALFA 10000 UNIT/ML IJ SOLN
15000.0000 [IU] | INTRAMUSCULAR | Status: DC
  Administered 2018-07-25: 15000 [IU] via INTRAVENOUS
  Filled 2018-07-25 (×2): qty 2

## 2018-07-25 MED ORDER — EPOETIN ALFA 20000 UNIT/ML IJ SOLN: 15000.0000 [IU] | INTRAMUSCULAR | Status: DC

## 2018-07-25 NOTE — Progress Notes (Signed)
Daily Progress Note   Patient Name: Jenny [name]       Date: 07/25/2018 DOB: 18-Feb-1956  Age: 63 y.o. MRN#: [id number] Attending Physician: Gorden Harms, MD Primary Care Physician: Juluis Pitch, MD Admit Date: 07/17/2018  Reason for Consultation/Follow-up: Establishing goals of care  Subjective: Patient sitting up in bed. She has eaten 100% of her lunch. Discussed with [facility] has refused to take her back. [name] is in transit from [place] to [place]. I attempted to call [facility] for update.  Patient declined discussion today- stated she wanted to wait for TJ's arrival.  ROS  Length of Stay: 7  Current Medications: Scheduled Meds:  . amLODipine  2.5 mg Oral Daily  . atorvastatin  80 mg Oral Daily  . bumetanide  1 mg Oral BID  . carvedilol  37.5 mg Oral BID  . cholecalciferol  5,000 Units Oral Daily  . epoetin alfa  15,000 Units Intravenous Q Fri  . feeding supplement (ENSURE ENLIVE)  237 mL Oral Q24H  . guaiFENesin  600 mg Oral BID  . heparin  5,000 Units Subcutaneous Q8H  . insulin aspart  0-20 Units Subcutaneous TID WC  . insulin aspart  0-5 Units Subcutaneous QHS  . ipratropium-albuterol  3 mL Nebulization BID  . isosorbide mononitrate  60 mg Oral Daily  . levofloxacin  500 mg Oral Q48H  . metolazone  2.5 mg Oral Daily  . multivitamin with minerals  1 tablet Oral Daily  . predniSONE  50 mg Oral Q breakfast  . silver sulfADIAZINE  1 application Topical TID  . sodium bicarbonate  650 mg Oral TID  . sodium chloride flush  3 mL Intravenous Q12H  . ascorbic acid  500 mg Oral BID    Continuous Infusions: . sodium chloride 250 mL (07/19/18 2131)  . albumin human 12.5 g (07/25/18 0915)    PRN Meds: sodium chloride, acetaminophen, ALPRAZolam,  guaiFENesin, loperamide, ondansetron (ZOFRAN) IV, polyethylene glycol, sodium chloride flush, traZODone  Physical Exam          Vital Signs: BP (!) 170/71 (BP Location: Left Arm)   Pulse 68   Temp 97.7 F (36.5 C) (Oral)   Resp 18   Ht 5\' 5"  (1.651 m)   Wt 73.3 kg   SpO2 97%   BMI 26.89 kg/m  SpO2: SpO2: 97 % O2 Device: O2 Device: Room Air O2 Flow Rate: O2 Flow Rate (L/min): 1.5 L/min  Intake/output summary:   Intake/Output Summary (Last 24 hours) at 07/25/2018 1307 Last data filed at 07/25/2018 1204 Gross per 24 hour  Intake 90.24 ml  Output 600 ml  Net -509.76 ml   LBM: Last BM Date: 07/22/18 Baseline Weight: Weight: 59 kg Most recent weight: Weight: 73.3 kg       Palliative Assessment/Data:    Flowsheet Rows     Most Recent Value  Intake Tab  Referral Department  Hospitalist  Unit at Time of Referral  Cardiac/Telemetry Unit  Palliative Care Primary Diagnosis  Cardiac  Date Notified  07/17/18  Palliative Care Type  Return patient Palliative Care  Reason for referral  Clarify Goals of Care  Date of Admission  07/17/18  Date first seen by Palliative Care  07/17/18  # of days Palliative referral response time  0 Day(s)  # of days IP prior to Palliative referral  0  Clinical Assessment  Palliative Performance Scale Score  40%  Pain Max last 24 hours  Not able to report  Pain Min Last 24 hours  Not able to report  Dyspnea Max Last 24 Hours  Not able to report  Dyspnea Min Last 24 hours  Not able to report  Psychosocial & Spiritual Assessment  Palliative Care Outcomes  Patient/Family meeting held?  Yes  Who was at the meeting?  patient at bedside.       Patient Active Problem List   Diagnosis Date Noted  . Malnutrition of moderate degree 07/25/2018  . Pneumonia   . Acute renal failure ([place])   . Advanced care planning/counseling discussion   . Status post thoracentesis   . Respiratory failure ([place]) 06/26/2018  . Goals of care, counseling/discussion   .  Palliative care by specialist   . DNR (do not resuscitate) discussion   . CHF (congestive heart failure) ([place]) 05/29/2018    Palliative Care Assessment & Plan   Patient Profile: 63 y.o.femalewith past medical history of heart failure, stage IV kidney disease, diabetes, hypertension, anemia complicated by Jehovah's Witness, will not accept blood productsadmitted on 2/13/2020with acute on chronic respiratory failure/ acute on chronic systolic CHF.PMT consulted dt poor long term prognosis.  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 consult hematology  *Noted increase in WBC today- expect this may delay discharge  Continue aggressive medical care other than no dialysis  Will followup with patient and family on Tuesday if she is still admitted  Goals of Care and Additional Recommendations:  Limitations on Scope of Treatment: No Hemodialysis  Code Status:  Full code  Prognosis:   Unable to determine  Discharge Planning:  [place] for rehab with Palliative care service follow-up  Care plan was discussed with patient and social worker.  Thank you for allowing the Palliative Medicine Team to assist in the care of this patient.   Time In: 1610 Time Out: 1325 Total Time 40 minutes Prolonged Time Billed no      Greater than 50%  of this time was spent counseling and coordinating care related to the above assessment and plan.  Mariana Kaufman, AGNP-C Palliative Medicine   Please contact Palliative Medicine Team phone at [phone] for questions and concerns.

## 2018-07-25 NOTE — Clinical Social Work Note (Signed)
CSW spoke with patient's son [name], [phone] and he said that the family has not decided on which facility they want her to go to.  CSW informed him that CSW needs an answer to complete insurance authorization.  CSW explained that if insurance does not approve they will have to look at either going home with home health or paying privately.  [name] expressed understanding, he said they are considering having her come home with home health, they are trying to figure out if there is enough help available.  [name] stated that she does have oxygen at home, but they would probably need a hospital bed.  CSW informed him that CSW needs an answer tomorrow on which facility, or if they are going to tak.e her home with home health and palliative to follow or hospice services.  CSW informed patient's son there is not medical reason to keep her any longer, and CSW does not want her to have unexpected charges for the hospital stay.  [name] again said he will make a decision tomorrow, and he will also be back in [facility] tomorrow.  CSW continuing to follow patient's progress throughout discharge planning.  Jones Broom. Laiya Wisby, MSW, LCSW [phone]  07/25/2018 5:18 PM

## 2018-07-25 NOTE — Progress Notes (Signed)
Palliative Note  Additional prolonged services: 1500-1545 45 minutes  I spoke with patient's sister, Modesta Sammons via phone (granted permission from patient and from patient's son to discuss patient's care with Bethena Roys). She states family members are gathering and coming to [facility] to assist patient in her decision making. Bethena Roys is coming from [place] and she has brothers coming from up [place].  Bethena Roys states that patient's son [name] is trying to find a way to take patient home rather than having her go to a facility.  I discussed patient's poor prognosis frankly with Bethena Roys. Discussed that patient would be eligible for Hospice services in the home if she was in agreement.  I spoke with Malachy Mood at the bedside. When I brought up her going home with [name] she said, "That would be great". I deferred discussing Hospice with patient until [name] was present as patient had expressed goals of care discussions not continue until he was there.  Above information was communicated to Kerby Moors and Dr. Jerelyn Charles.  Greater than 50%  of this time was spent counseling and coordinating care related to the above assessment and plan  Mariana Kaufman, AGNP-C Palliative Medicine  Please call Palliative Medicine team phone with any questions [phone]. For individual providers please see AMION.

## 2018-07-25 NOTE — Progress Notes (Signed)
Physical Therapy Treatment Patient Details Name: Jenny [name] MRN: [id number] DOB: [birth year] Today's Date: 07/25/2018    History of Present Illness 63 year old female admitted for respiratory failure and SOB with 2L O2 chronically was desatting.  Has anemia but will not accept a transfusion ([employer]ov[employer] Witness), did agree to thoracentisis.  Has pleural effusion from CHF.  PMHx:  anemia, CHF, DM, HTN, CKD stage 4.  She was here a few weeks ago, now returns from rehab.    PT Comments    Pt in bed, agrees with mod encouragement.  While she declines sitting EOB or other attempts at mobility, she agrees to supine exercises as below.  She denies pain but is limited by general weakness.  Encouragement given.   Follow Up Recommendations  SNF     Equipment Recommendations  None recommended by PT    Recommendations for Other Services       Precautions / Restrictions Precautions Precautions: Fall;Other (comment) Precaution Comments: O2 Restrictions Weight Bearing Restrictions: No    Mobility  Bed Mobility               General bed mobility comments: refused  Transfers                    Ambulation/Gait                 Stairs             Wheelchair Mobility    Modified Rankin (Stroke Patients Only)       Balance                                            Cognition Arousal/Alertness: Awake/alert Behavior During Therapy: WFL for tasks assessed/performed Overall Cognitive Status: Within Functional Limits for tasks assessed                                        Exercises Other Exercises Other Exercises: Supine AAROM ankle pumps, heel slides, SLR, ab/add 2 x 10 with good effort.    General Comments        Pertinent Vitals/Pain Pain Assessment: No/denies pain Pain Intervention(s): Monitored during session    Home Living                      Prior Function            PT Goals (current  goals can now be found in the care plan section) Progress towards PT goals: Progressing toward goals    Frequency    Min 2X/week      PT Plan Current plan remains appropriate    Co-evaluation              AM-PAC PT "6 Clicks" Mobility   Outcome Measure  Help needed turning from your back to your side while in a flat bed without using bedrails?: A Lot Help needed moving from lying on your back to sitting on the side of a flat bed without using bedrails?: A Lot Help needed moving to and from a bed to a chair (including a wheelchair)?: Total Help needed standing up from a chair using your arms (e.g., wheelchair or bedside chair)?: Total Help needed to walk in hospital room?: Total Help needed climbing  3-5 steps with a railing? : Total 6 Click Score: 8    End of Session Equipment Utilized During Treatment: Oxygen Activity Tolerance: Patient tolerated treatment well;Patient limited by fatigue Patient left: in bed;with call bell/phone within reach;with bed alarm set         Time: 2010-0712 PT Time Calculation (min) (ACUTE ONLY): 11 min  Charges:  $Therapeutic Exercise: 8-22 mins                     Chesley Noon, PTA 07/25/18, 9:54 AM

## 2018-07-25 NOTE — Progress Notes (Signed)
[place] at [place] NAME: Genevra Orne    MR#:  [id number]  DATE OF BIRTH:  1955-11-11  SUBJECTIVE:  CHIEF COMPLAINT:   Patient without complaint, awaiting insurance authorization to return to skilled nursing facility, case discussed with nephrology as well as nursing staff  REVIEW OF SYSTEMS:  CONSTITUTIONAL: No fever, fatigue or weakness.  EYES: No blurred or double vision.  EARS, NOSE, AND THROAT: No tinnitus or ear pain.  RESPIRATORY: No cough, shortness of breath, wheezing or hemoptysis.  CARDIOVASCULAR: No chest pain, orthopnea, edema.  GASTROINTESTINAL: No nausea, vomiting, diarrhea or abdominal pain.  GENITOURINARY: No dysuria, hematuria.  ENDOCRINE: No polyuria, nocturia,  HEMATOLOGY: No anemia, easy bruising or bleeding SKIN: No rash or lesion. MUSCULOSKELETAL: No joint pain or arthritis.   NEUROLOGIC: No tingling, numbness, weakness.  PSYCHIATRY: No anxiety or depression.   ROS  DRUG ALLERGIES:   Allergies  Allergen Reactions  . Penicillins Rash    VITALS:  Blood pressure (!) 170/71, pulse 68, temperature 97.7 F (36.5 C), temperature source Oral, resp. rate 18, height 5\' 5"  (1.651 m), weight 73.3 kg, SpO2 97 %.  PHYSICAL EXAMINATION:  GENERAL:  63 y.o.-year-old patient lying in the bed with no acute distress.  EYES: Pupils equal, round, reactive to light and accommodation. No scleral icterus. Extraocular muscles intact.  HEENT: Head atraumatic, normocephalic. Oropharynx and nasopharynx clear.  NECK:  Supple, no jugular venous distention. No thyroid enlargement, no tenderness.  LUNGS: Normal breath sounds bilaterally, no wheezing, rales,rhonchi or crepitation. No use of accessory muscles of respiration.  CARDIOVASCULAR: S1, S2 normal. No murmurs, rubs, or gallops.  ABDOMEN: Soft, nontender, nondistended. Bowel sounds present. No organomegaly or mass.  EXTREMITIES: No pedal edema, cyanosis, or clubbing.  NEUROLOGIC:  Cranial nerves II through XII are intact. Muscle strength 5/5 in all extremities. Sensation intact. Gait not checked.  PSYCHIATRIC: The patient is alert and oriented x 3.  SKIN: No obvious rash, lesion, or ulcer.   Physical Exam LABORATORY PANEL:   CBC Recent Labs  Lab 07/25/18 0637  WBC 19.2*  HGB 7.8*  HCT 25.3*  PLT 263   ------------------------------------------------------------------------------------------------------------------  Chemistries  Recent Labs  Lab 07/19/18 0427  07/24/18 0438  NA 138   < > 141  K 3.9   < > 4.9  CL 108   < > 110  CO2 18*   < > 17*  GLUCOSE 253*   < > 105*  BUN 112*   < > 146*  CREATININE 3.62*   < > 3.70*  CALCIUM 6.7*   < > 6.8*  MG 1.8  --   --   AST 18  --   --   ALT 36  --   --   ALKPHOS 82  --   --   BILITOT 0.9  --   --    < > = values in this interval not displayed.   ------------------------------------------------------------------------------------------------------------------  Cardiac Enzymes No results for input(s): TROPONINI in the last 168 hours. ------------------------------------------------------------------------------------------------------------------  RADIOLOGY:  No results found.  ASSESSMENT AND PLAN:  63 year old female past medical history significant for chronic respiratory failure secondary to COPD,CHF on home oxygen, CKD stage IV, diabetes,hypertension,frequent hospitalizations for similar presentation,[facility]  *Acute on chronic hypoxic respiratory failure Resolved secondary tomultifactorial process that includes acute on chronic systolic congestive heart failure/COPDexacerbation,/pneumoniaacute on chronicleft pleural effusion, and chronic severe anemia of chronic disease Regarding pleural effusion patient had  thoracocentesis on February 13,600 mL of fluid drained, cultures were negative, treated with  supplemental oxygen-on chronic 2 L via nasal cannula at home  *cardioRenal syndrome, acute on chronic systolic heart failure Tenous state in discussion with nephrology Was treated with heparin drip while in house, change back to Bumex 1 mg twice daily, Zaroxolyn 2.5 mg daily Patient is not interested in hemodialysis  * Acute on COPD exacerbation  Resolved Treated with IV Solu-Medrol, continue prednisone taper, aggressive pulmonary toilet bronchodilator therapy, mucolytic agents  *Acute on chronic left pleural effusion  Resolved Moderate to large in size  Status post thoracocentesis on 2/13, 600 mL of fluid removed.  *CKD stage IV Worsening renal function with elevated BUN, seen by nephrology-treated with Lasix drip, patient not interested in hemodialysis, converted to p.o. Bumex and Zaroxolyn,  Most recentrenal ultrasound with increased echotexture of bilateral kidneys  *Chronic adult failure to thrive with malnutrition Continue increased nursing care diet PRN, aspiration/fall/skin care precautions while in house, palliative care consulted given long-term poor prognosis  *Chronic microcytic anemia patient is a Jehovah's Witness Hemoglobin is stable  Epogenweekly will be continued  *Chronic hypertension controlled ContinueImdur, Coreg and Norvasc  *Hypothermia Resolved Chest x-ray concerning for pneumonia  Levaquin for short course  Disposition to skilled nursing facility once bed is available  All the records are reviewed and case discussed with Care Management/Social Workerr. Management plans discussed with the patient, family and they are in agreement.  CODE STATUS: full  TOTAL TIME TAKING CARE OF THIS PATIENT: 35 minutes.     POSSIBLE D/C IN 0-5 DAYS, DEPENDING ON CLINICAL CONDITION.   Avel Peace Salary M.D on 07/25/2018   Between 7am to 6pm - Pager - [phone]  After 6pm go to [email or web address] - password [email or web address] [facility]   Office  [phone]  CC: Primary care physician; Juluis Pitch, MD  Note: This dictation was prepared with Dragon dictation along with smaller phrase technology. Any transcriptional errors that result from this process are unintentional.

## 2018-07-25 NOTE — Progress Notes (Signed)
New referral for out patient Palliative to follow received form CSW Evette Cristal. Discharge facility pending. Patient information faxed to referral. Flo Shanks RN, BSN, [facility], hospital Liaison [phone]

## 2018-07-26 LAB — GLUCOSE, CAPILLARY
Glucose-Capillary: 230 mg/dL — ABNORMAL HIGH (ref 70–99)
Glucose-Capillary: 214 mg/dL — ABNORMAL HIGH (ref 70–99)
Glucose-Capillary: 241 mg/dL — ABNORMAL HIGH (ref 70–99)
Glucose-Capillary: 275 mg/dL — ABNORMAL HIGH (ref 70–99)

## 2018-07-26 NOTE — Progress Notes (Signed)
Ch f/u with pt post-op. Pt stated that she was not in any pain after the procedure. Pt had a flat affect but was open to talking about an AD. Ch recalled that pt wanted to complete the AD during their last admission but it was not finalized based on pt declining. Ch informed the pt that the AD would protect her from receiving an procedures that did not align with her beliefs. Pt understood and share that her family would be coming Monday and they could look over it together then. Ch left an AD for pt to review as well as a copy of the healthcare policy from [employer] site for pt to include specific instructions on her AD with family present. Ch provided a compassionate presence with pt. F/ u recommended.     07/26/18 1000  Clinical Encounter Type  Visited With Patient  Visit Type Follow-up;Psychological support;Spiritual support;Social support;Post-op  Spiritual Encounters  Spiritual Needs Emotional;Grief support  Stress Factors  Patient Stress Factors Health changes;Lack of caregivers;Loss;Loss of control;Major life changes  Family Stress Factors None identified  Advance Directives (For Healthcare)  Does Patient Have a Medical Advance Directive? No  Would patient like information on creating a medical advance directive? Yes (Inpatient - patient defers creating a medical advance directive at this time) (Pt is waiting for family to be present to complete AD)

## 2018-07-26 NOTE — Progress Notes (Signed)
Advanced care plan.  Purpose of the Encounter: CODE STATUS  Parties in Attendance: I discussed the case with son Tonna Boehringer  Patient's Decision Capacity: Not intact  Subjective/Patient's story:  Patient 63 year old with chronic diastolic and systolic CHF, cardiorenal syndrome, chronic kidney disease stage IV, severe hypoalbuminemia admitted with acute respiratory failure  Objective/Medical story I discussed with the son regarding overall poor prognosis discussed with him regarding CODE STATUS   Goals of care determination:   Only recommended him to make decision regarding his mother to be a DNR.  Also offered services possibly for home including hospice at home.  Son is to decide on the CODE STATUS  CODE STATUS:  Full code  Time spent discussing advanced care planning: 16 minutes

## 2018-07-26 NOTE — Progress Notes (Signed)
[place] at [place] NAME: Jenny [name]    MR#:  [id number]  DATE OF BIRTH:  [birth year]  SUBJECTIVE:  CHIEF COMPLAINT:   Patient without complaints, family has not decided about patient going back to skilled nursing facility  REVIEW OF SYSTEMS:  CONSTITUTIONAL: No fever, fatigue or weakness.  EYES: No blurred or double vision.  EARS, NOSE, AND THROAT: No tinnitus or ear pain.  RESPIRATORY: No cough, shortness of breath, wheezing or hemoptysis.  CARDIOVASCULAR: No chest pain, orthopnea, edema.  GASTROINTESTINAL: No nausea, vomiting, diarrhea or abdominal pain.  GENITOURINARY: No dysuria, hematuria.  ENDOCRINE: No polyuria, nocturia,  HEMATOLOGY: No anemia, easy bruising or bleeding SKIN: No rash or lesion. MUSCULOSKELETAL: No joint pain or arthritis.   NEUROLOGIC: No tingling, numbness, weakness.  PSYCHIATRY: No anxiety or depression.   ROS  DRUG ALLERGIES:   Allergies  Allergen Reactions  . Penicillins Rash    VITALS:  Blood pressure (!) 171/78, pulse 77, temperature 97.7 F (36.5 C), temperature source Oral, resp. rate 19, height 5\' 5"  (1.651 m), weight 70.3 kg, SpO2 99 %.  PHYSICAL EXAMINATION:  GENERAL:  63 y.o.-year-old patient lying in the bed with no acute distress.  EYES: Pupils equal, round, reactive to light and accommodation. No scleral icterus. Extraocular muscles intact.  HEENT: Head atraumatic, normocephalic. Oropharynx and nasopharynx clear.  NECK:  Supple, no jugular venous distention. No thyroid enlargement, no tenderness.  LUNGS: Normal breath sounds bilaterally, no wheezing, rales,rhonchi or crepitation. No use of accessory muscles of respiration.  CARDIOVASCULAR: S1, S2 normal. No murmurs, rubs, or gallops.  ABDOMEN: Soft, nontender, nondistended. Bowel sounds present. No organomegaly or mass.  EXTREMITIES: No pedal edema, cyanosis, or clubbing.  NEUROLOGIC: Cranial nerves II through XII are intact. Muscle  strength 5/5 in all extremities. Sensation intact. Gait not checked.  PSYCHIATRIC: The patient is alert SKIN: No obvious rash, lesion, or ulcer.   Physical Exam LABORATORY PANEL:   CBC Recent Labs  Lab 07/25/18 0637  WBC 19.2*  HGB 7.8*  HCT 25.3*  PLT 263   ------------------------------------------------------------------------------------------------------------------  Chemistries  Recent Labs  Lab 07/24/18 0438  NA 141  K 4.9  CL 110  CO2 17*  GLUCOSE 105*  BUN 146*  CREATININE 3.70*  CALCIUM 6.8*   ------------------------------------------------------------------------------------------------------------------  Cardiac Enzymes No results for input(s): TROPONINI in the last 168 hours. ------------------------------------------------------------------------------------------------------------------  RADIOLOGY:  No results found.  ASSESSMENT AND PLAN:  63 year old female past medical history significant for chronic respiratory failure secondary to COPD,CHF on home oxygen, CKD stage IV, diabetes,hypertension,frequent hospitalizations for similar presentation,[facility] recurrentresp[facility] failure  *Acute on chronic hypoxic respiratory failure Resolved Due to tomultifactorial process that includes acute on chronic systolic congestive heart failure/COPDexacerbation,/pneumoniaacute on chronicleft pleural effusion, and chronic severe anemia of chronic disease Regarding pleural effusion patient had thoracocentesis on February 13,600 mL of fluid drained, cultures were negative, treated with supplemental oxygen-on chronic 2 L via nasal cannula at home  *cardioRenal syndrome, acute on chronic systolic heart failure Tenous state in discussion with nephrology Was treated with heparin drip while in house, change back to Bumex 1 mg twice daily, Zaroxolyn 2.5 mg daily Patient is not interested in hemodialysis  * Acute  on COPD exacerbation  Resolved Treated with IV Solu-Medrol, continue prednisone taper, aggressive pulmonary toilet bronchodilator therapy, mucolytic agents  *Acute on chronic left pleural effusion  Resolved Moderate to large in size  Status post thoracocentesis on 2/13, 600 mL  of fluid removed.  *CKD stage IV Worsening renal function with elevated BUN, seen by nephrology-treated with Lasix drip, patient not interested in hemodialysis, converted to p.o. Bumex and Zaroxolyn,  Most recentrenal ultrasound with increased echotexture of bilateral kidneys  *Chronic adult failure to thrive with malnutrition Continue increased nursing care diet PRN, aspiration/fall/skin care precautions while in house, palliative care consulted given long-term poor prognosis  *Chronic microcytic anemia patient is a Jehovah's Witness Hemoglobin is stable  Epogenweekly will be continued  *Chronic hypertension controlled ContinueImdur, Coreg and Norvasc  *Hypothermia Resolved Chest x-ray concerning for pneumonia  Levaquin for short course  Received a call regarding patient's son has not decided about patient returning to a skilled facility and wants her to go home.  I updated son and gave him a prognosis.  Which is very poor  All the records are reviewed and case discussed with Care Management/Social Workerr. Management plans discussed with the patient, family and they are in agreement.  CODE STATUS: full  TOTAL TIME TAKING CARE OF THIS PATIENT: 35 minutes.     POSSIBLE D/C IN 0-5 DAYS, DEPENDING ON CLINICAL CONDITION.   Dustin Flock M.D on 07/26/2018   Between 7am to 6pm - Pager - [phone]  After 6pm go to [email or web address] - password [email or web address] [facility]  Office  [phone]  CC: Primary care physician; Juluis Pitch, MD  Note: This dictation was prepared with Dragon dictation along with smaller phrase technology. Any transcriptional errors that result  from this process are unintentional.

## 2018-07-26 NOTE — Clinical Social Work Note (Signed)
The CSW contacted the patient's son at his request to discuss discharge options. The patient's family seems to continue to want to pursue home with hospice care or home health; however, they have concerns about ensuring that equipment will be in place prior to the patient returning home and making sure a skilled person can help with ADLs and medications. The CSW explained the basic differences between Home with Home Health vs. SNF vs. Home with Home Hospice. The CSW reiterated information provided by the patient's NP for Palliative Care note. The CSW advised the patient's family members that the [facility] would have more information about when a hospital bed can be delivered and the frequency of visits from home health vs. Home hospice. The family agreed to a phone call from the [facility]; the CSW updated the [facility] of request. The CSW is following.  Santiago Bumpers, MSW, Latanya Presser [phone]

## 2018-07-27 LAB — URINALYSIS, COMPLETE (UACMP) WITH MICROSCOPIC
Bilirubin Urine: NEGATIVE
Glucose, UA: NEGATIVE mg/dL
Ketones, ur: NEGATIVE mg/dL
Nitrite: NEGATIVE
Protein, ur: 100 mg/dL — AB
Specific Gravity, Urine: 1.014 (ref 1.005–1.030)
Squamous Epithelial / HPF: NONE SEEN (ref 0–5)
pH: 7 (ref 5.0–8.0)

## 2018-07-27 LAB — GLUCOSE, CAPILLARY
Glucose-Capillary: 245 mg/dL — ABNORMAL HIGH (ref 70–99)
Glucose-Capillary: 260 mg/dL — ABNORMAL HIGH (ref 70–99)
Glucose-Capillary: 312 mg/dL — ABNORMAL HIGH (ref 70–99)

## 2018-07-27 MED ORDER — CEFUROXIME AXETIL 500 MG PO TABS: 500.0000 mg | ORAL_TABLET | Freq: Two times a day (BID) | ORAL | 0 refills | Status: DC

## 2018-07-27 NOTE — Progress Notes (Deleted)
SATURATION QUALIFICATIONS: (This note is used to comply with regulatory documentation for home oxygen)  Patient Saturations on Room Air at Rest = 85%  Patient Saturations on Room Air while Ambulating pt unable to ambulate  Patient Saturations on  Liters of oxygen while Ambulating   Please briefly explain why patient needs home oxygen: Patient oxygen drops to 85% on room air without excertion

## 2018-07-27 NOTE — Plan of Care (Signed)
  Problem: Nutrition: Goal: Adequate nutrition will be maintained Outcome: Progressing   Problem: Pain Managment: Goal: General experience of comfort will improve Outcome: Progressing   

## 2018-07-27 NOTE — Progress Notes (Signed)
[facility] called to update care staff that transport may not be available until tomorrow morning. RN called non-emergent transport line to notify them of the delay and patient will be the first scheduled transport of the day tomorrow morning. Pt's son [name] called with no answer, but RN left a message with update on transport. Earleen Reaper, RN

## 2018-07-27 NOTE — Discharge Summary (Deleted)
[facility] NAME: Jenny [name]    MR#:  [id number]  DATE OF BIRTH:  1955-10-05  DATE OF ADMISSION:  07/17/2018 ADMITTING PHYSICIAN: Gorden Harms, MD  DATE OF DISCHARGE: No discharge date for patient encounter.  PRIMARY CARE PHYSICIAN: Juluis Pitch, MD    ADMISSION DIAGNOSIS:  Hypocalcemia [E83.51] Shortness of breath [R06.02] Dyspnea [R06.00] Acute on chronic combined systolic and diastolic congestive heart failure (HCC) [I50.43] Acute respiratory failure with hypoxia (HCC) [J96.01] Acute on chronic congestive heart failure, unspecified heart failure type ([place]) [I50.9]  DISCHARGE DIAGNOSIS:  Active Problems:   Respiratory failure (HCC)   Status post thoracentesis   Pneumonia   Acute renal failure ([place])   Advanced care planning/counseling discussion   Malnutrition of moderate degree   Pleural effusion   Shortness of breath   Patient declines dialysis   SECONDARY DIAGNOSIS:   Past Medical History:  Diagnosis Date  . Anemia   . CHF (congestive heart failure) ([place])   . CKD (chronic kidney disease) stage 4, GFR 15-29 ml/min (HCC)   . Diabetes mellitus without complication ([place])   . Hypertension   . Patient is [place] COURSE:  63 year old female past medical history significant for chronic respiratory failure secondary to COPD,CHF on home oxygen, CKD stage IV, diabetes,hypertension,frequent hospitalizations for similar presentation,[facility] recurrentrespiratory failure  *Acute on chronic hypoxic respiratory failure Resolved secondary tomultifactorial process that includes acute on chronic systolic congestive heart failure/COPDexacerbation,/pneumonia acute on chronicleft pleural effusion, and chronic severe anemia of chronic disease  Regarding pleural effusion patient had thoracocentesis on February 13, 600 mL of fluid drained,  cultures were negative, treated with supplemental oxygen-on chronic 2 L via nasal cannula at home  *cardioRenal syndrome, acute on chronic systolic heart failure Tenous state in discussion with nephrology Was treated with heparin drip while in house, change back to Bumex 1 mg twice daily, Zaroxolyn 2.5 mg daily Patient is not interested in hemodialysis  * Acute on COPD exacerbation  Resolved Treated with IV Solu-Medrol, aggressive pulmonary toilet bronchodilator therapy, mucolytic agents  *Acute on chronic left pleural effusion  Resolved Moderate to large in size  Status post thoracocentesis on 2/13, 600 mL of fluid removed.  *CKD stage IV Worsening renal function with elevated BUN, seen by nephrology-treated with Lasix drip, patient not interested in hemodialysis, converted to p.o. Bumex and Zaroxolyn,  Most recentrenal ultrasound with increased echotexture of bilateral kidneys  *Chronic adult failure to thrive  Increased nursing care diet PRN, aspiration/fall/skin care precautions while in house, palliative care consulted given long-term poor prognosis  *Chronic microcytic anemia patient is a Jehovah's Witness Hemoglobin is stable  Epogenweekly will be continued  *Chronic hypertension controlled ContinueImdur, Coreg and Norvasc  *Hypothermia Resolved Chest x-ray concerning for pneumonia  Treated with Levaquin  *Noted to have urinary tract infection we will treat with Ceftin  DISCHARGE CONDITIONS:   stable  CONSULTS OBTAINED:  Treatment Team:  Lavonia Dana, MD Dustin Flock, MD  DRUG ALLERGIES:   Allergies  Allergen Reactions  . Penicillins Rash    DISCHARGE MEDICATIONS:   Allergies as of 07/27/2018      Reactions   Penicillins Rash      Medication List    TAKE these medications   acetaminophen 325 MG tablet Commonly known as:  TYLENOL Take 2 tablets (650 mg total) by mouth every 6 (six) hours as needed for mild pain (  or Fever >/=  101).   amLODipine 2.5 MG tablet Commonly known as:  NORVASC Take 1 tablet (2.5 mg total) by mouth daily. What changed:    medication strength  how much to take   ascorbic acid 250 MG tablet Commonly known as:  VITAMIN C Take 500 mg by mouth 2 (two) times daily.   atorvastatin 80 MG tablet Commonly known as:  LIPITOR Take 80 mg by mouth daily.   bumetanide 1 MG tablet Commonly known as:  BUMEX Take 1 tablet (1 mg total) by mouth 2 (two) times daily.   carvedilol 25 MG tablet Commonly known as:  COREG Take 1 tablet by mouth 2 (two) times daily.   cefUROXime 500 MG tablet Commonly known as:  CEFTIN Take 1 tablet (500 mg total) by mouth 2 (two) times daily for 7 days.   dextromethorphan-guaiFENesin 30-600 MG 12hr tablet Commonly known as:  MUCINEX DM Take 1 tablet by mouth 2 (two) times daily as needed for cough.   epoetin alfa-epbx 10000 UNIT/ML injection Commonly known as:  RETACRIT 15,000 Units every Friday.   feeding supplement (ENSURE ENLIVE) Liqd Take 237 mLs by mouth daily.   feeding supplement (PRO-STAT SUGAR FREE 64) Liqd Take 30 mLs by mouth 2 (two) times daily.   ipratropium-albuterol 0.5-2.5 (3) MG/3ML Soln Commonly known as:  DUONEB Take 3 mLs by nebulization every 6 (six) hours as needed (wheezing, shortness of breath). What changed:  when to take this   isosorbide mononitrate 60 MG 24 hr tablet Commonly known as:  IMDUR Take 1 tablet (60 mg total) by mouth daily.   loperamide 2 MG capsule Commonly known as:  IMODIUM Take 2 mg by mouth every 6 (six) hours as needed for diarrhea or loose stools.   metolazone 2.5 MG tablet Commonly known as:  ZAROXOLYN Take 2.5 mg by mouth daily.   multivitamin tablet Take 1 tablet by mouth daily.   polyethylene glycol packet Commonly known as:  MIRALAX / GLYCOLAX Take 17 g by mouth daily as needed for mild constipation.   silver sulfADIAZINE 1 % cream Commonly known as:  SILVADENE Apply 1 application  topically 3 (three) times daily. Apply to sacrum and bilateral buttocks.   sodium bicarbonate 650 MG tablet Take 1 tablet (650 mg total) by mouth 3 (three) times daily.   traZODone 50 MG tablet Commonly known as:  DESYREL Take 1 tablet (50 mg total) by mouth at bedtime as needed for sleep.   Vitamin D3 125 MCG (5000 UT) Caps Take 5,000 Units by mouth daily.            Durable Medical Equipment  (From admission, onward)         Start     Ordered   07/27/18 1345  DME Oxygen  Once    Question Answer Comment  Mode or (Route) Nasal cannula   Liters per Minute 2   Frequency Continuous (stationary and portable oxygen unit needed)   Oxygen delivery system Gas      07/27/18 1345   07/27/18 0933  For home use only DME Hospital bed  Once    Question Answer Comment  Patient has (list medical condition): COPD/CHF   The above medical condition requires: Patient requires the ability to reposition immediately   Head must be elevated greater than: 45 degrees   Bed type Semi-electric   [employer] Yes   [place] Yes      07/27/18 0934   07/27/18 0932  For home  use only DME oxygen  Once    Question Answer Comment  Mode or (Route) Nasal cannula   Liters per Minute 2   Frequency Continuous (stationary and portable oxygen unit needed)   Oxygen conserving device No   Oxygen delivery system Gas      07/27/18 0932           DISCHARGE INSTRUCTIONS:   If you experience worsening of your admission symptoms, develop shortness of breath, life threatening emergency, suicidal or homicidal thoughts you must seek medical attention immediately by calling 911 or calling your MD immediately  if symptoms less severe.  You Must read complete instructions/literature along with all the possible adverse reactions/side effects for all the Medicines you take and that have been prescribed to you. Take any new Medicines after you have completely understood and accept all the possible adverse  reactions/side effects.   Please note  You were cared for by a hospitalist during your hospital stay. If you have any questions about your discharge medications or the care you received while you were in the hospital after you are discharged, you can call the unit and asked to speak with the hospitalist on call if the hospitalist that took care of you is not available. Once you are discharged, your primary care physician will handle any further medical issues. Please note that NO REFILLS for any discharge medications will be authorized once you are discharged, as it is imperative that you return to your primary care physician (or establish a relationship with a primary care physician if you do not have one) for your aftercare needs so that they can reassess your need for medications and monitor your lab values.    Today   CHIEF COMPLAINT:  No chief complaint on file.   HISTORY OF PRESENT ILLNESS:  63 y.o. female with a known history per below, presenting from local nursing home with acute shortness of breath, on 2 L chronically, recent hospital discharge approximately 2 weeks ago for respiratory failure due to COPD/heart failure/chronic left pleural effusion-patient refused thoracentesis at that time, brought to the emergency room via EMS for recurrent shortness of breath, noted O2 saturation into the 80s, had been short of breath for the last 24 hours, dyspnea with exertion/activity, noted worsening swelling in the legs, ER work-up noted for BNP greater than 1300, hemoglobin 7.7-patient is Jehovah's Witness, chest x-ray noted for edema/persistent left moderate-sized pleural effusion, patient evaluated in the emergency room, no apparent distress, resting comfortably in bed, patient is now been admitted for acute on chronic hypoxic respiratory failure suspected secondary to acute on chronic systolic congestive heart failure, acute on chronic left-sided moderate pleural effusion, and COPD.   VITAL  SIGNS:  Blood pressure (!) 165/67, pulse 75, temperature 98.6 F (37 C), temperature source Oral, resp. rate 19, height 5\' 5"  (1.651 m), weight 67.1 kg, SpO2 97 %.  I/O:    Intake/Output Summary (Last 24 hours) at 07/27/2018 1348 Last data filed at 07/27/2018 0213 Gross per 24 hour  Intake -  Output 1000 ml  Net -1000 ml    PHYSICAL EXAMINATION:  GENERAL:  63 y.o.-year-old patient lying in the bed with no acute distress.  EYES: Pupils equal, round, reactive to light and accommodation. No scleral icterus. Extraocular muscles intact.  HEENT: Head atraumatic, normocephalic. Oropharynx and nasopharynx clear.  NECK:  Supple, no jugular venous distention. No thyroid enlargement, no tenderness.  LUNGS: Normal breath sounds bilaterally, no wheezing, rales,rhonchi or crepitation. No use of accessory muscles  of respiration.  CARDIOVASCULAR: S1, S2 normal. No murmurs, rubs, or gallops.  ABDOMEN: Soft, non-tender, non-distended. Bowel sounds present. No organomegaly or mass.  EXTREMITIES: No pedal edema, cyanosis, or clubbing.  NEUROLOGIC: Cranial nerves II through XII are intact. Muscle strength 5/5 in all extremities. Sensation intact. Gait not checked.  PSYCHIATRIC: The patient is alert and oriented x 3.  SKIN: No obvious rash, lesion, or ulcer.   DATA REVIEW:   CBC Recent Labs  Lab 07/25/18 0637  WBC 19.2*  HGB 7.8*  HCT 25.3*  PLT 263    Chemistries  Recent Labs  Lab 07/24/18 0438  NA 141  K 4.9  CL 110  CO2 17*  GLUCOSE 105*  BUN 146*  CREATININE 3.70*  CALCIUM 6.8*    Cardiac Enzymes No results for input(s): TROPONINI in the last 168 hours.  Microbiology Results  Results for orders placed or performed during the hospital encounter of 06/26/18  MRSA PCR Screening     Status: None   Collection Time: 06/29/18  2:57 AM  Result Value Ref Range Status   MRSA by PCR NEGATIVE NEGATIVE Final    Comment:        The GeneXpert MRSA Assay (FDA approved for NASAL  specimens only), is one component of a comprehensive MRSA colonization surveillance program. It is not intended to diagnose MRSA infection nor to guide or monitor treatment for MRSA infections. Performed at [facility], [address] [place], [place] [address]     RADIOLOGY:  No results found.  EKG:   Orders placed or performed during the hospital encounter of 07/17/18  . ED EKG  . ED EKG  . EKG 12-Lead  . EKG 12-Lead      Management plans discussed with the patient, family and they are in agreement.  CODE STATUS:     Code Status Orders  (From admission, onward)         Start     Ordered   07/17/18 0810  Full code  Continuous     07/17/18 0809        Code Status History    Date Active Date Inactive Code Status Order ID Comments User Context   06/26/2018 2215 07/01/2018 2210 Full Code [id number]  Gorden Harms, MD Inpatient   05/29/2018 2023 05/30/2018 2034 Full Code [id number]  Hillary Bow, MD ED      TOTAL TIME TAKING CARE OF THIS PATIENT: 40 minutes.    Dustin Flock M.D on 07/27/2018 at 1:48 PM  Between 7am to 6pm - Pager - [phone]  After 6pm go to [email or web address] - password [email or web address] [facility]  Office  [phone]  CC: Primary care physician; Juluis Pitch, MD   Note: This dictation was prepared with Dragon dictation along with smaller phrase technology. Any transcriptional errors that result from this process are unintentional.

## 2018-07-27 NOTE — Care Management Note (Signed)
Case Management Note  Patient Details  Name: Ayza Ripoll MRN: [id number] Date of Birth: July 11, 1955  Subjective/Objective:  Patient to be discharged per MD order. Orders in place for home health services. Family had considered SNF but opted for home health with outpatient palliative. We discussed all options at length. Outpatient palliative consult set up through [facility]. CMS Medicare.gov Compare Post Acute Care list reviewed with patient and [facility] home health has been chosen. Referral confirmed with Malachy Mood. Family also requested a PCS list which was given. [facility] and is pending delivery. Patient on chronic O2 through [facility] home care. Patient has portable tanks and concentrators at home. EMS for transport once bed is delivered.                   Action/Plan:   Expected Discharge Date:  07/27/18               Expected Discharge Plan:  Skilled Nursing Facility  In-House Referral:  Clinical Social Work  Discharge planning Services  CM Consult  Post Acute Care Choice:  Home Health, Durable Medical Equipment Choice offered to:  Patient, Adult Children  DME Arranged:  Hospital bed DME Agency:  [place]:  RN, PT, Nurse's Aide, Social Work [employer] Agency:  [employer]  Status of Service:  Completed, signed off  If discussed at [employer], dates discussed:    Additional Comments:  Latanya Maudlin, RN 07/27/2018, 2:07 PM

## 2018-07-27 NOTE — Progress Notes (Signed)
Pt's son [name] called to notify care staff that the pt's hospital bed has been delivered and the pt can be discharged. [facility] non-emergent transport called and RN to notify pt's son [name] upon pt's discharge. Earleen Reaper, RN

## 2018-07-27 NOTE — Care Management (Signed)
Patient suffers from CHF and COPD and has difficulty breathing when head of the bed is less than 45 degrees. Bed wedges do not provide enough elevation to resolve breathing issues. Shortness of breath and dyspnea cause patient to require frequent and /or immediate changes in body position which cannot be achieved with a normal bed.

## 2018-07-28 LAB — GLUCOSE, CAPILLARY: GLUCOSE-CAPILLARY: 172 mg/dL — AB (ref 70–99)

## 2018-07-28 MED ORDER — TRAZODONE HCL 50 MG PO TABS: 50.0000 mg | ORAL_TABLET | Freq: Every evening | ORAL | 0 refills | Status: AC | PRN

## 2018-07-28 MED ORDER — AMLODIPINE BESYLATE 2.5 MG PO TABS: 2.5000 mg | ORAL_TABLET | Freq: Every day | ORAL | 0 refills | Status: AC

## 2018-07-28 MED ORDER — ENSURE ENLIVE PO LIQD: 237.0000 mL | ORAL | 12 refills | Status: AC

## 2018-07-28 MED ORDER — CEFUROXIME AXETIL 500 MG PO TABS: 500.0000 mg | ORAL_TABLET | Freq: Two times a day (BID) | ORAL | 0 refills | Status: AC

## 2018-07-28 MED ORDER — SODIUM BICARBONATE 650 MG PO TABS: 650.0000 mg | ORAL_TABLET | Freq: Three times a day (TID) | ORAL | 0 refills | Status: AC

## 2018-07-28 NOTE — Discharge Summary (Signed)
[facility] [place] NAME: Jenny [name]    MR#:  [id number]  DATE OF BIRTH:  [birth year]  DATE OF ADMISSION:  07/17/2018 ADMITTING PHYSICIAN: Gorden Harms, MD  DATE OF DISCHARGE: No discharge date for patient encounter.  PRIMARY CARE PHYSICIAN: Juluis Pitch, MD    ADMISSION DIAGNOSIS:  Hypocalcemia [E83.51] Shortness of breath [R06.02] Dyspnea [R06.00] Acute on chronic combined systolic and diastolic congestive heart failure (HCC) [I50.43] Acute respiratory failure with hypoxia (HCC) [J96.01] Acute on chronic congestive heart failure, unspecified heart failure type ([employer]) [I50.9]  DISCHARGE DIAGNOSIS:  Active Problems:   Respiratory failure (HCC)   Status post thoracentesis   Pneumonia   Acute renal failure ([place])   Advanced care planning/counseling discussion   Malnutrition of moderate degree   Pleural effusion   Shortness of breath   Patient declines dialysis   SECONDARY DIAGNOSIS:   Past Medical History:  Diagnosis Date  . Anemia   . CHF (congestive heart failure) ([place])   . CKD (chronic kidney disease) stage 4, GFR 15-29 ml/min (HCC)   . Diabetes mellitus without complication ([employer])   . Hypertension   . Patient is [place] COURSE:  63 year old female past medical history significant for chronic respiratory failure secondary to COPD,CHF on home oxygen, CKD stage IV, diabetes,hypertension,frequent hospitalizations for similar presentation,[facility]  *Acute on chronic hypoxic respiratory failure Resolved secondary tomultifactorial process that includes acute on chronic systolic congestive heart failure/COPDexacerbation,/pneumonia acute on chronicleft pleural effusion, and chronic severe anemia of chronic disease  Regarding pleural effusion patient had thoracocentesis on February 13, 600 mL of fluid drained,  cultures were negative, treated with supplemental oxygen-on chronic 2 L via nasal cannula at home  *cardioRenal syndrome, acute on chronic systolic heart failure Tenous state in discussion with nephrology Was treated with heparin drip while in house, change back to Bumex 1 mg twice daily, Zaroxolyn 2.5 mg daily Patient is not interested in hemodialysis  * Acute on COPD exacerbation  Resolved Treated with IV Solu-Medrol, aggressive pulmonary toilet bronchodilator therapy, mucolytic agents  *Acute on chronic left pleural effusion  Resolved Moderate to large in size  Status post thoracocentesis on 2/13, 600 mL of fluid removed.  *CKD stage IV Worsening renal function with elevated BUN, seen by nephrology-treated with Lasix drip, patient not interested in hemodialysis, converted to p.o. Bumex and Zaroxolyn,  Most recentrenal ultrasound with increased echotexture of bilateral kidneys  *Chronic adult failure to thrive  Increased nursing care diet PRN, aspiration/fall/skin care precautions while in house, palliative care consulted given long-term poor prognosis  *Chronic microcytic anemia patient is a Jehovah's Witness Hemoglobin is stable  Epogenweekly will be continued  *Chronic hypertension controlled ContinueImdur, Coreg and Norvasc  *Hypothermia Resolved Chest x-ray concerning for pneumonia  Treated with Levaquin  *Noted to have urinary tract infection we will treat with Ceftin  DISCHARGE CONDITIONS:   stable  CONSULTS OBTAINED:  Treatment Team:  Lavonia Dana, MD Dustin Flock, MD  DRUG ALLERGIES:   Allergies  Allergen Reactions  . Penicillins Rash    DISCHARGE MEDICATIONS:   Allergies as of 07/28/2018      Reactions   Penicillins Rash      Medication List    TAKE these medications   acetaminophen 325 MG tablet Commonly known as:  TYLENOL Take 2 tablets (650 mg total) by mouth every 6 (six) hours as needed for mild pain (  or Fever >/=  101).   amLODipine 2.5 MG tablet Commonly known as:  NORVASC Take 1 tablet (2.5 mg total) by mouth daily. What changed:    medication strength  how much to take   ascorbic acid 250 MG tablet Commonly known as:  VITAMIN C Take 500 mg by mouth 2 (two) times daily.   atorvastatin 80 MG tablet Commonly known as:  LIPITOR Take 80 mg by mouth daily.   bumetanide 1 MG tablet Commonly known as:  BUMEX Take 1 tablet (1 mg total) by mouth 2 (two) times daily.   carvedilol 25 MG tablet Commonly known as:  COREG Take 1 tablet by mouth 2 (two) times daily.   cefUROXime 500 MG tablet Commonly known as:  CEFTIN Take 1 tablet (500 mg total) by mouth 2 (two) times daily for 7 days.   dextromethorphan-guaiFENesin 30-600 MG 12hr tablet Commonly known as:  MUCINEX DM Take 1 tablet by mouth 2 (two) times daily as needed for cough. Notes to patient:  Take as needed   epoetin alfa-epbx 10000 UNIT/ML injection Commonly known as:  RETACRIT 15,000 Units every Friday. Notes to patient:  Received Friday 07/25/2018   feeding supplement (ENSURE ENLIVE) Liqd Take 237 mLs by mouth daily.   feeding supplement (PRO-STAT SUGAR FREE 64) Liqd Take 30 mLs by mouth 2 (two) times daily. Notes to patient:  Resume as normal   ipratropium-albuterol 0.5-2.5 (3) MG/3ML Soln Commonly known as:  DUONEB Take 3 mLs by nebulization every 6 (six) hours as needed (wheezing, shortness of breath). What changed:  when to take this Notes to patient:  Take as needed   isosorbide mononitrate 60 MG 24 hr tablet Commonly known as:  IMDUR Take 1 tablet (60 mg total) by mouth daily.   loperamide 2 MG capsule Commonly known as:  IMODIUM Take 2 mg by mouth every 6 (six) hours as needed for diarrhea or loose stools. Notes to patient:  Take as needed   metolazone 2.5 MG tablet Commonly known as:  ZAROXOLYN Take 2.5 mg by mouth daily.   multivitamin tablet Take 1 tablet by mouth daily.   polyethylene glycol  packet Commonly known as:  MIRALAX / GLYCOLAX Take 17 g by mouth daily as needed for mild constipation. Notes to patient:  Take as needed   silver sulfADIAZINE 1 % cream Commonly known as:  SILVADENE Apply 1 application topically 3 (three) times daily. Apply to sacrum and bilateral buttocks.   sodium bicarbonate 650 MG tablet Take 1 tablet (650 mg total) by mouth 3 (three) times daily.   traZODone 50 MG tablet Commonly known as:  DESYREL Take 1 tablet (50 mg total) by mouth at bedtime as needed for sleep. Notes to patient:  Take as needed   Vitamin D3 125 MCG (5000 UT) Caps Take 5,000 Units by mouth daily.            Durable Medical Equipment  (From admission, onward)         Start     Ordered   07/27/18 1345  DME Oxygen  Once    Question Answer Comment  Mode or (Route) Nasal cannula   Liters per Minute 2   Frequency Continuous (stationary and portable oxygen unit needed)   Oxygen delivery system Gas      07/27/18 1345   07/27/18 0933  For home use only DME Hospital bed  Once    Question Answer Comment  Patient has (list medical condition): COPD/CHF   The above  medical condition requires: Patient requires the ability to reposition immediately   Head must be elevated greater than: 45 degrees   Bed type Semi-electric   [employer] Yes   [place] Yes      07/27/18 0934   07/27/18 0932  For home use only DME oxygen  Once    Question Answer Comment  Mode or (Route) Nasal cannula   Liters per Minute 2   Frequency Continuous (stationary and portable oxygen unit needed)   Oxygen conserving device No   Oxygen delivery system Gas      07/27/18 0932           DISCHARGE INSTRUCTIONS:   If you experience worsening of your admission symptoms, develop shortness of breath, life threatening emergency, suicidal or homicidal thoughts you must seek medical attention immediately by calling 911 or calling your MD immediately  if symptoms less severe.  You Must read  complete instructions/literature along with all the possible adverse reactions/side effects for all the Medicines you take and that have been prescribed to you. Take any new Medicines after you have completely understood and accept all the possible adverse reactions/side effects.   Please note  You were cared for by a hospitalist during your hospital stay. If you have any questions about your discharge medications or the care you received while you were in the hospital after you are discharged, you can call the unit and asked to speak with the hospitalist on call if the hospitalist that took care of you is not available. Once you are discharged, your primary care physician will handle any further medical issues. Please note that NO REFILLS for any discharge medications will be authorized once you are discharged, as it is imperative that you return to your primary care physician (or establish a relationship with a primary care physician if you do not have one) for your aftercare needs so that they can reassess your need for medications and monitor your lab values.    Today   CHIEF COMPLAINT:  No chief complaint on file.   HISTORY OF PRESENT ILLNESS:  63 y.o. female with a known history per below, presenting from local nursing home with acute shortness of breath, on 2 L chronically, recent hospital discharge approximately 2 weeks ago for respiratory failure due to COPD/heart failure/chronic left pleural effusion-patient refused thoracentesis at that time, brought to the emergency room via EMS for recurrent shortness of breath, noted O2 saturation into the 80s, had been short of breath for the last 24 hours, dyspnea with exertion/activity, noted worsening swelling in the legs, ER work-up noted for BNP greater than 1300, hemoglobin 7.7-patient is Jehovah's Witness, chest x-ray noted for edema/persistent left moderate-sized pleural effusion, patient evaluated in the emergency room, no apparent distress,  resting comfortably in bed, patient is now been admitted for acute on chronic hypoxic respiratory failure suspected secondary to acute on chronic systolic congestive heart failure, acute on chronic left-sided moderate pleural effusion, and COPD.   VITAL SIGNS:  Blood pressure 138/67, pulse 76, temperature 98.7 F (37.1 C), temperature source Oral, resp. rate 18, height 5\' 5"  (1.651 m), weight 73.5 kg, SpO2 98 %.  I/O:   No intake or output data in the 24 hours ending 07/28/18 0843  PHYSICAL EXAMINATION:  GENERAL:  63 y.o.-year-old patient lying in the bed with no acute distress.  EYES: Pupils equal, round, reactive to light and accommodation. No scleral icterus. Extraocular muscles intact.  HEENT: Head atraumatic, normocephalic. Oropharynx and nasopharynx clear.  NECK:  Supple, no jugular venous distention. No thyroid enlargement, no tenderness.  LUNGS: Normal breath sounds bilaterally, no wheezing, rales,rhonchi or crepitation. No use of accessory muscles of respiration.  CARDIOVASCULAR: S1, S2 normal. No murmurs, rubs, or gallops.  ABDOMEN: Soft, non-tender, non-distended. Bowel sounds present. No organomegaly or mass.  EXTREMITIES: No pedal edema, cyanosis, or clubbing.  NEUROLOGIC: Cranial nerves II through XII are intact. Muscle strength 5/5 in all extremities. Sensation intact. Gait not checked.  PSYCHIATRIC: The patient is alert and oriented x 3.  SKIN: No obvious rash, lesion, or ulcer.   DATA REVIEW:   CBC Recent Labs  Lab 07/25/18 0637  WBC 19.2*  HGB 7.8*  HCT 25.3*  PLT 263    Chemistries  Recent Labs  Lab 07/24/18 0438  NA 141  K 4.9  CL 110  CO2 17*  GLUCOSE 105*  BUN 146*  CREATININE 3.70*  CALCIUM 6.8*    Cardiac Enzymes No results for input(s): TROPONINI in the last 168 hours.  Microbiology Results  Results for orders placed or performed during the hospital encounter of 06/26/18  MRSA PCR Screening     Status: None   Collection Time: 06/29/18   2:57 AM  Result Value Ref Range Status   MRSA by PCR NEGATIVE NEGATIVE Final    Comment:        The GeneXpert MRSA Assay (FDA approved for NASAL specimens only), is one component of a comprehensive MRSA colonization surveillance program. It is not intended to diagnose MRSA infection nor to guide or monitor treatment for MRSA infections. Performed at [facility], [address] [place], [place] [address]     RADIOLOGY:  No results found.  EKG:   Orders placed or performed during the hospital encounter of 07/17/18  . ED EKG  . ED EKG  . EKG 12-Lead  . EKG 12-Lead      Management plans discussed with the patient, family and they are in agreement.  CODE STATUS:     Code Status Orders  (From admission, onward)         Start     Ordered   07/17/18 0810  Full code  Continuous     07/17/18 0809        Code Status History    Date Active Date Inactive Code Status Order ID Comments User Context   06/26/2018 2215 07/01/2018 2210 Full Code [id number]  Gorden Harms, MD Inpatient   05/29/2018 2023 05/30/2018 2034 Full Code [id number]  Hillary Bow, MD ED      TOTAL TIME TAKING CARE OF THIS PATIENT: 40 minutes.    Dustin Flock M.D on 07/28/2018 at 8:43 AM  Between 7am to 6pm - Pager - [phone]  After 6pm go to [email or web address] - password [email or web address] [facility]  Office  [phone]  CC: Primary care physician; Juluis Pitch, MD   Note: This dictation was prepared with Dragon dictation along with smaller phrase technology. Any transcriptional errors that result from this process are unintentional.

## 2018-07-28 NOTE — Clinical Social Work Note (Signed)
CSW received referral for SNF.  Case discussed with case manager and plan is to discharge home with home health.  CSW to sign off please re-consult if social work needs arise.  Jenny [name], MSW, LCSW [phone]  

## 2018-07-28 NOTE — Progress Notes (Signed)
[facility] [name] at [place] NAME: Jenny [name]    MR#:  [id number]  DATE OF BIRTH:  10-05-1955  SUBJECTIVE:  CHIEF COMPLAINT:   Patient was unable to be transferred to home yesterday due to transportation issues.  Had to remain in the hospital  REVIEW OF SYSTEMS:  CONSTITUTIONAL: No fever, fatigue or weakness.  EYES: No blurred or double vision.  EARS, NOSE, AND THROAT: No tinnitus or ear pain.  RESPIRATORY: No cough, shortness of breath, wheezing or hemoptysis.  CARDIOVASCULAR: No chest pain, orthopnea, edema.  GASTROINTESTINAL: No nausea, vomiting, diarrhea or abdominal pain.  GENITOURINARY: No dysuria, hematuria.  ENDOCRINE: No polyuria, nocturia,  HEMATOLOGY: No anemia, easy bruising or bleeding SKIN: No rash or lesion. MUSCULOSKELETAL: No joint pain or arthritis.   NEUROLOGIC: No tingling, numbness, weakness.  PSYCHIATRY: No anxiety or depression.   ROS  DRUG ALLERGIES:   Allergies  Allergen Reactions  . Penicillins Rash    VITALS:  Blood pressure 138/67, pulse 76, temperature 98.7 F (37.1 C), temperature source Oral, resp. rate 18, height 5\' 5"  (1.651 m), weight 73.5 kg, SpO2 98 %.  PHYSICAL EXAMINATION:  GENERAL:  63 y.o.-year-old patient lying in the bed with no acute distress.  EYES: Pupils equal, round, reactive to light and accommodation. No scleral icterus. Extraocular muscles intact.  HEENT: Head atraumatic, normocephalic. Oropharynx and nasopharynx clear.  NECK:  Supple, no jugular venous distention. No thyroid enlargement, no tenderness.  LUNGS: Normal breath sounds bilaterally, no wheezing, rales,rhonchi or crepitation. No use of accessory muscles of respiration.  CARDIOVASCULAR: S1, S2 normal. No murmurs, rubs, or gallops.  ABDOMEN: Soft, nontender, nondistended. Bowel sounds present. No organomegaly or mass.  EXTREMITIES: No pedal edema, cyanosis, or clubbing.  NEUROLOGIC: Cranial nerves II through XII are intact.  Muscle strength 5/5 in all extremities. Sensation intact. Gait not checked.  PSYCHIATRIC: The patient is alert SKIN: No obvious rash, lesion, or ulcer.   Physical Exam LABORATORY PANEL:   CBC Recent Labs  Lab 07/25/18 0637  WBC 19.2*  HGB 7.8*  HCT 25.3*  PLT 263   ------------------------------------------------------------------------------------------------------------------  Chemistries  Recent Labs  Lab 07/24/18 0438  NA 141  K 4.9  CL 110  CO2 17*  GLUCOSE 105*  BUN 146*  CREATININE 3.70*  CALCIUM 6.8*   ------------------------------------------------------------------------------------------------------------------  Cardiac Enzymes No results for input(s): TROPONINI in the last 168 hours. ------------------------------------------------------------------------------------------------------------------  RADIOLOGY:  No results found.  ASSESSMENT AND PLAN:  63 year old female past medical history significant for chronic respiratory failure secondary to COPD,CHF on home oxygen, CKD stage IV, diabetes,hypertension,frequent hospitalizations for similar presentation,[facility]  *Acute on chronic hypoxic respiratory failure Resolved Due to tomultifactorial process that includes acute on chronic systolic congestive heart failure/COPDexacerbation,/pneumoniaacute on chronicleft pleural effusion, and chronic severe anemia of chronic disease Regarding pleural effusion patient had thoracocentesis on February 13,600 mL of fluid drained, cultures were negative, treated with supplemental oxygen-on chronic 2 L via nasal cannula at home  *cardioRenal syndrome, acute on chronic systolic heart failure Tenous state in discussion with nephrology Was treated with heparin drip while in house, change back to Bumex 1 mg twice daily, Zaroxolyn 2.5 mg daily Patient is not interested in  hemodialysis  * Acute on COPD exacerbation  Resolved Treated with IV Solu-Medrol, continue prednisone taper, aggressive pulmonary toilet bronchodilator therapy, mucolytic agents  *Acute on chronic left pleural effusion  Resolved Moderate to large in size  Status post thoracocentesis  on 2/13, 600 mL of fluid removed.  *CKD stage IV Worsening renal function with elevated BUN, seen by nephrology-treated with Lasix drip, patient not interested in hemodialysis, converted to p.o. Bumex and Zaroxolyn,  Most recentrenal ultrasound with increased echotexture of bilateral kidneys Outpatient follow-up with primary nephrologist  *Chronic adult failure to thrive with malnutrition Continue increased nursing care diet PRN, aspiration/fall/skin care precautions while in house, palliative care consulted given long-term poor prognosis, still a full code  *Chronic microcytic anemia patient is a Jehovah's Witness Hemoglobin is stable  Epogenweekly will be continued  *Chronic hypertension controlled ContinueImdur, Coreg and Norvasc  *Hypothermia Resolved Chest x-ray concerning for pneumonia  Levaquin for short course has finished the course  Received a call regarding patient's son has not decided about patient returning to a skilled facility and wants her to go home.  I updated son and gave him a prognosis.  Which is very poor  All the records are reviewed and case discussed with Care Management/Social Workerr. Management plans discussed with the patient, family and they are in agreement.  CODE STATUS: full  TOTAL TIME TAKING CARE OF THIS PATIENT: 35 minutes.     POSSIBLE D/C IN 0-5 DAYS, DEPENDING ON CLINICAL CONDITION.   Dustin Flock M.D on 07/28/2018   Between 7am to 6pm - Pager - [phone]  After 6pm go to [email or web address] - password [email or web address] [facility]  Office  [phone]  CC: Primary care physician; Juluis Pitch, MD  Note: This  dictation was prepared with Dragon dictation along with smaller phrase technology. Any transcriptional errors that result from this process are unintentional.

## 2018-07-28 NOTE — Plan of Care (Signed)
  Problem: Spiritual Needs Goal: Ability to function at adequate level Outcome: Adequate for Discharge   Problem: Ineffective Breathing Pattern Goal: Ability to achieve and maintain adequate cardiopulmonary perfusion will improve Description Ability to maintain and achieve adequate oxygenation  Outcome: Adequate for Discharge   Problem: Education: Goal: Knowledge of General Education information will improve Description Including pain rating scale, medication(s)/side effects and non-pharmacologic comfort measures Outcome: Adequate for Discharge   Problem: Health Behavior/Discharge Planning: Goal: Ability to manage health-related needs will improve Outcome: Adequate for Discharge   Problem: Clinical Measurements: Goal: Ability to maintain clinical measurements within normal limits will improve Outcome: Adequate for Discharge Goal: Will remain free from infection Outcome: Adequate for Discharge Goal: Diagnostic test results will improve Outcome: Adequate for Discharge Goal: Respiratory complications will improve Outcome: Adequate for Discharge Goal: Cardiovascular complication will be avoided Outcome: Adequate for Discharge   Problem: Activity: Goal: Risk for activity intolerance will decrease Outcome: Adequate for Discharge   Problem: Nutrition: Goal: Adequate nutrition will be maintained Outcome: Adequate for Discharge   Problem: Coping: Goal: Level of anxiety will decrease Outcome: Adequate for Discharge   Problem: Elimination: Goal: Will not experience complications related to bowel motility Outcome: Adequate for Discharge Goal: Will not experience complications related to urinary retention Outcome: Adequate for Discharge   Problem: Pain Managment: Goal: General experience of comfort will improve Outcome: Adequate for Discharge   Problem: Safety: Goal: Ability to remain free from injury will improve Outcome: Adequate for Discharge   Problem: Skin  Integrity: Goal: Risk for impaired skin integrity will decrease Outcome: Adequate for Discharge

## 2018-07-28 NOTE — Progress Notes (Signed)
Patient given discharge instructions. Discharge went over with son Tonna Boehringer over the phone as well. Son notified of patients discharge. Patient being transported to home via EMS. Vital signs WDL. IV's taken out. Discharge packet with prescriptions given to EMS.

## 2018-07-31 ENCOUNTER — Ambulatory Visit: Payer: BC Managed Care – PPO | Admitting: Family

## 2018-11-03 DEATH — deceased

## 2020-05-28 IMAGING — DX DG CHEST 1V
1 series · 1 of 1 positions shown · non-contrast
Comparison: 05/29/2018 and 04/05/2018

CLINICAL DATA: Increase shortness-of-breath 1 day.

EXAM:
CHEST  1 VIEW

[chest ap]
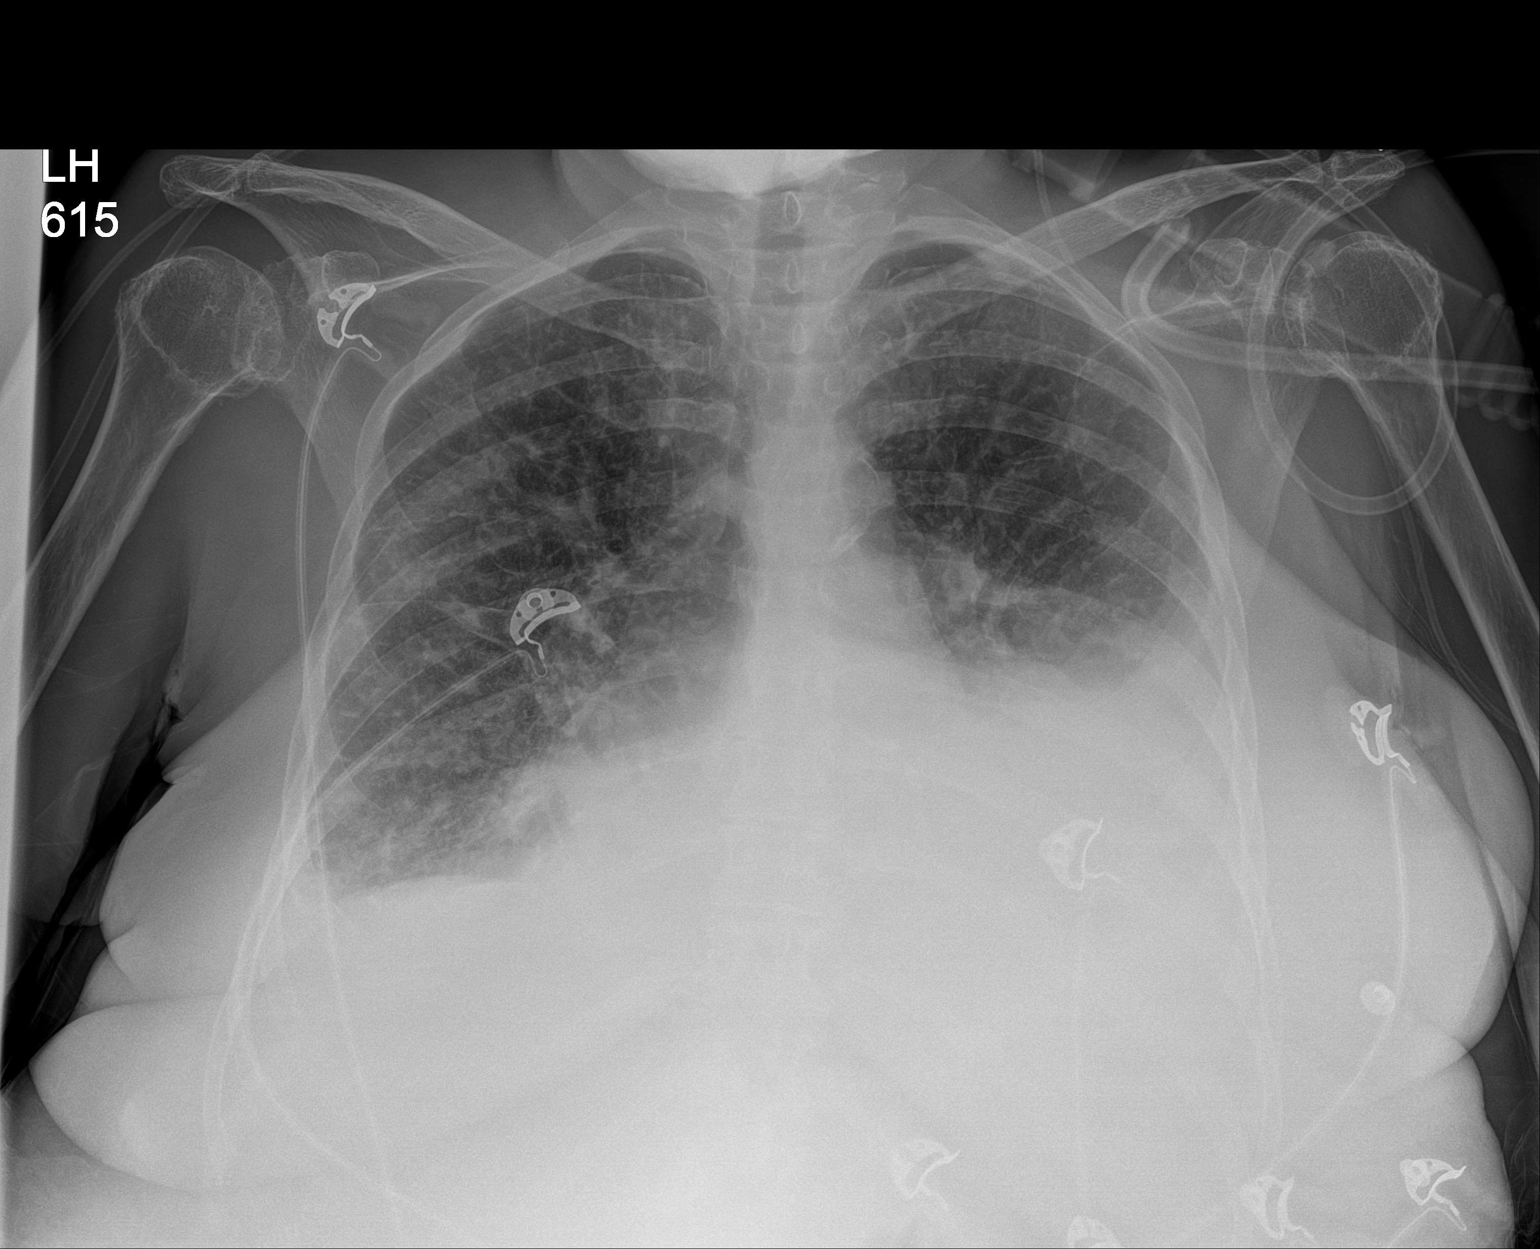

[1 of 1 positions shown; findings below may reference images not displayed]

FINDINGS: Lungs are somewhat hypoinflated with worsening opacification over
the right base and left mid to lower lung likely worsening moderate
size left effusion and small right effusion with associated
bibasilar atelectasis. Infection in the lung bases is possible.
There is hazy prominence of the perihilar markings suggesting mild
interstitial edema. Remainder of the exam is unchanged.
IMPRESSION: Worsening opacification over the left mid to lower lung and right
base suggesting moderate left effusion and small right effusion with
associated bibasilar atelectasis. Infection in the lung bases is
possible.

Suggestion mild interstitial edema.

## 2020-06-18 IMAGING — US US THORACENTESIS ASP PLEURAL SPACE W/IMG GUIDE
1 series · 2 of 2 positions shown · non-contrast
Comparison: Chest radiograph-earlier same day

MEDICATIONS:
None.

COMPLICATIONS:
None immediate.

INDICATION: Symptomatic left sided pleural effusion

EXAM:
US THORACENTESIS ASP PLEURAL SPACE W/IMG GUIDE
TECHNIQUE: Informed written consent was obtained from the patient after a
discussion of the risks, benefits and alternatives to treatment. A
timeout was performed prior to the initiation of the procedure.

[Series 1: us thoracentesis asp pleural space w/img guide · 0.22mm/px · 2 of 2 slices shown]
[im 1/2]
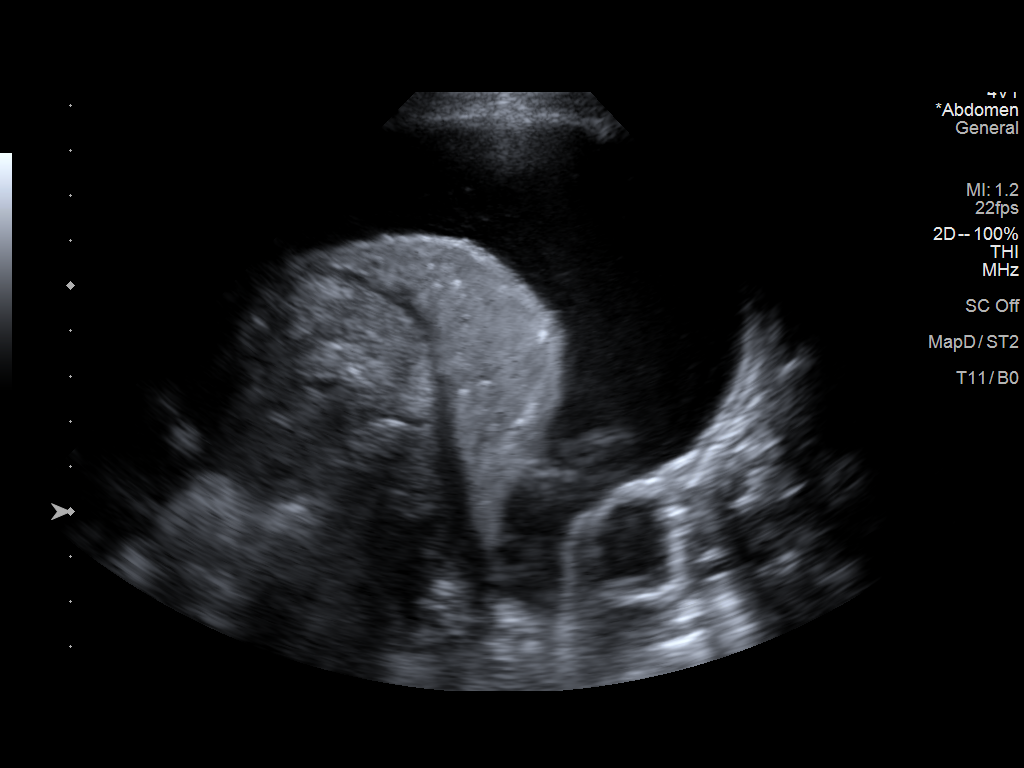
[im 2/2]
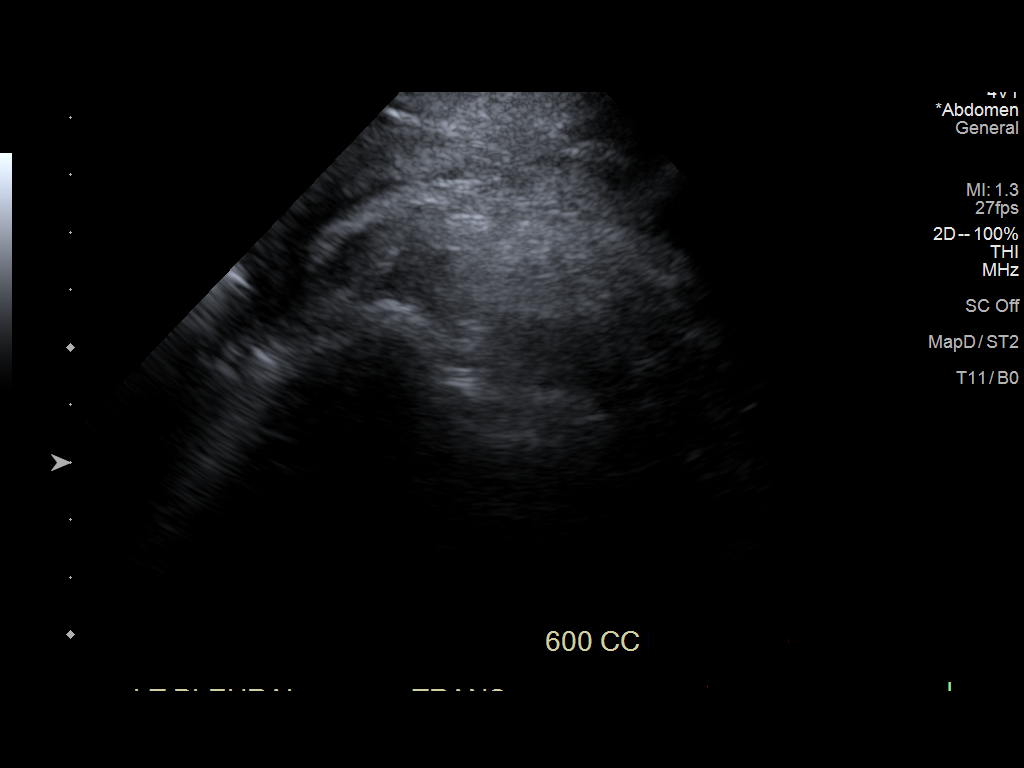

[2 of 2 positions shown; findings below may reference images not displayed]

Initial ultrasound scanning demonstrates a moderate sized anechoic
left-sided pleural effusion. The lower chest was prepped and draped
in the usual sterile fashion. 1% lidocaine was used for local
anesthesia.

An ultrasound image was saved for documentation purposes. An 8 Fr
Safe-T-Centesis catheter was introduced. The thoracentesis was
performed. The catheter was removed and a dressing was applied. The
patient tolerated the procedure well without immediate post
procedural complication. The patient was escorted to have an upright
chest radiograph.
FINDINGS: A total of approximately 600 cc of serous fluid was removed.
Requested samples were sent to the laboratory.
IMPRESSION: Successful ultrasound-guided left sided thoracentesis yielding 600
cc of pleural fluid.

## 2020-06-18 IMAGING — DX DG CHEST 1V PORT
1 series · 1 of 1 positions shown · non-contrast
Comparison: 06/28/2018

CLINICAL DATA: Difficulty breathing

EXAM:
PORTABLE CHEST 1 VIEW

[chest ap]
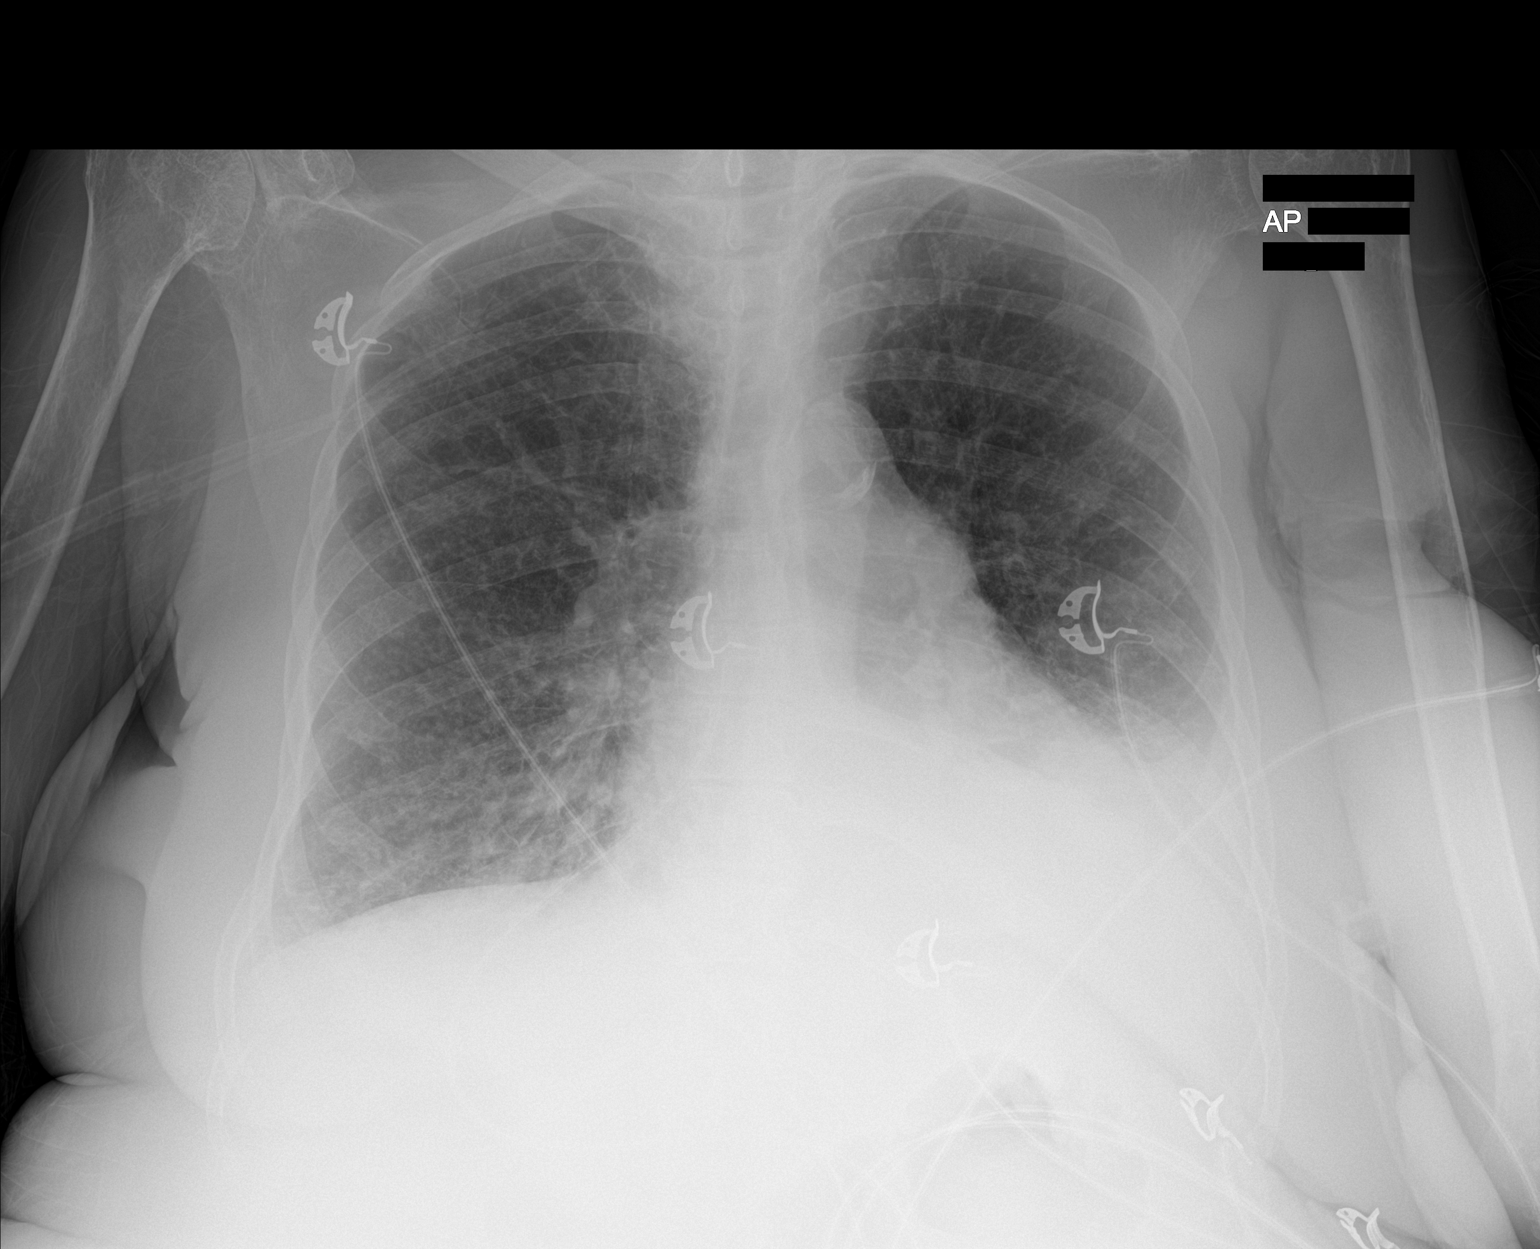

[1 of 1 positions shown; findings below may reference images not displayed]

FINDINGS: Cardiomegaly with vascular congestion. Moderate left pleural
effusion with left lower lobe atelectasis. Mild interstitial
prominence likely reflects interstitial edema, similar to prior
study.
IMPRESSION: Cardiomegaly with vascular congestion and mild interstitial edema.

Moderate left effusion with left base atelectasis.
